# Patient Record
Sex: Male | Born: 1990 | Race: Black or African American | Hispanic: No | Marital: Single | Smoking: Current every day smoker
Health system: Southern US, Community
[De-identification: ages and names within clinical notes are randomized; demographics above are authoritative.]

---
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
## 2010-09-04 LAB — COMPREHENSIVE METABOLIC PANEL
ALT: 21 U/L (ref 0–53)
AST: 25 U/L (ref 0–37)
Albumin: 4.3 g/dL (ref 3.5–5.2)
Alkaline Phosphatase: 105 U/L (ref 52–171)
BUN: 10 mg/dL (ref 6–23)
CO2: 24 mEq/L (ref 19–32)
Calcium: 9.2 mg/dL (ref 8.4–10.5)
Chloride: 104 mEq/L (ref 96–112)
Creatinine, Ser: 1.09 mg/dL (ref 0.4–1.5)
Glucose, Bld: 84 mg/dL (ref 70–99)
Potassium: 4.1 mEq/L (ref 3.5–5.1)
Sodium: 142 mEq/L (ref 135–145)
Total Bilirubin: 1.4 mg/dL — ABNORMAL HIGH (ref 0.3–1.2)
Total Protein: 6.9 g/dL (ref 6.0–8.3)

## 2010-09-04 LAB — DIFFERENTIAL
Basophils Absolute: 0.1 10*3/uL (ref 0.0–0.1)
Basophils Relative: 1 % (ref 0–1)
Eosinophils Absolute: 0.1 10*3/uL (ref 0.0–1.2)
Eosinophils Relative: 1 % (ref 0–5)
Lymphocytes Relative: 14 % — ABNORMAL LOW (ref 24–48)
Lymphs Abs: 1.1 10*3/uL (ref 1.1–4.8)
Monocytes Absolute: 0.4 10*3/uL (ref 0.2–1.2)
Monocytes Relative: 5 % (ref 3–11)
Neutro Abs: 6.4 10*3/uL (ref 1.7–8.0)
Neutrophils Relative %: 80 % — ABNORMAL HIGH (ref 43–71)

## 2010-09-04 LAB — CBC
HCT: 44.4 % (ref 36.0–49.0)
Hemoglobin: 14.9 g/dL (ref 12.0–16.0)
MCHC: 33.5 g/dL (ref 31.0–37.0)
MCV: 95.7 fL (ref 78.0–98.0)
Platelets: 164 10*3/uL (ref 150–400)
RBC: 4.64 MIL/uL (ref 3.80–5.70)
RDW: 14.4 % (ref 11.4–15.5)
WBC: 8.1 10*3/uL (ref 4.5–13.5)

## 2010-09-04 LAB — ACETAMINOPHEN LEVEL: Acetaminophen (Tylenol), Serum: <10 ug/mL — ABNORMAL LOW (ref 10–30)

## 2010-09-04 LAB — SALICYLATE LEVEL: Salicylate Lvl: <4 mg/dL (ref 2.8–20.0)

## 2010-09-07 LAB — GC/CHLAMYDIA PROBE AMP, GENITAL
Chlamydia, DNA Probe: POSITIVE — AB
GC Probe Amp, Genital: NEGATIVE

## 2011-01-10 ENCOUNTER — Emergency Department (HOSPITAL_COMMUNITY)
Admission: EM | Admit: 2011-01-10 | Discharge: 2011-01-11 | Discharge status: Against Medical Advice | Payer: Self-pay | Attending: Emergency Medicine | Admitting: Emergency Medicine

## 2011-01-10 DIAGNOSIS — Z0389 Encounter for observation for other suspected diseases and conditions ruled out: Secondary | ICD-10-CM | POA: Insufficient documentation

## 2011-01-10 LAB — DIFFERENTIAL
Basophils Absolute: 0 10*3/uL (ref 0.0–0.1)
Basophils Relative: 1 % (ref 0–1)
Eosinophils Absolute: 0.2 10*3/uL (ref 0.0–0.7)
Eosinophils Relative: 4 % (ref 0–5)
Monocytes Absolute: 0.8 10*3/uL (ref 0.1–1.0)

## 2011-01-10 LAB — BASIC METABOLIC PANEL
BUN: 9 mg/dL (ref 6–23)
GFR calc Af Amer: >60 mL/min (ref >60)
GFR calc non Af Amer: >60 mL/min (ref >60)
Potassium: 4 mEq/L (ref 3.5–5.1)
Sodium: 139 mEq/L (ref 135–145)

## 2011-01-10 LAB — CBC
MCHC: 34.7 g/dL (ref 30.0–36.0)
Platelets: 173 10*3/uL (ref 150–400)
RDW: 13.9 % (ref 11.5–15.5)

## 2011-06-01 ENCOUNTER — Emergency Department (HOSPITAL_COMMUNITY): Payer: Self-pay

## 2011-06-01 ENCOUNTER — Emergency Department (HOSPITAL_COMMUNITY)
Admission: EM | Admit: 2011-06-01 | Discharge: 2011-06-01 | Disposition: A | Discharge status: Home / Self Care | Payer: Self-pay | Attending: Emergency Medicine | Admitting: Emergency Medicine

## 2011-06-01 ENCOUNTER — Encounter: Payer: Self-pay | Admitting: *Deleted

## 2011-06-01 DIAGNOSIS — R6884 Jaw pain: Secondary | ICD-10-CM | POA: Insufficient documentation

## 2011-06-01 DIAGNOSIS — R071 Chest pain on breathing: Secondary | ICD-10-CM | POA: Insufficient documentation

## 2011-06-01 DIAGNOSIS — F17200 Nicotine dependence, unspecified, uncomplicated: Secondary | ICD-10-CM | POA: Insufficient documentation

## 2011-06-01 DIAGNOSIS — S1190XA Unspecified open wound of unspecified part of neck, initial encounter: Secondary | ICD-10-CM | POA: Insufficient documentation

## 2011-06-01 DIAGNOSIS — R0789 Other chest pain: Secondary | ICD-10-CM

## 2011-06-01 MED ORDER — TRAMADOL HCL 50 MG PO TABS: 50.0000 mg | ORAL_TABLET | Freq: Four times a day (QID) | ORAL | Status: AC | PRN

## 2011-06-01 MED ORDER — IBUPROFEN 800 MG PO TABS: 800.0000 mg | ORAL_TABLET | Freq: Three times a day (TID) | ORAL | Status: AC
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
## 2011-06-04 NOTE — ED Provider Notes (Signed)
Medical screening examination/treatment/procedure(s) were performed by non-physician practitioner and as supervising physician I was immediately available for consultation/collaboration.   Sanaii Caporaso, MD 06/04/11 0759 

## 2011-11-30 ENCOUNTER — Emergency Department (HOSPITAL_COMMUNITY)
Admission: EM | Admit: 2011-11-30 | Discharge: 2011-11-30 | Disposition: A | Discharge status: Home / Self Care | Payer: Self-pay | Attending: Emergency Medicine | Admitting: Emergency Medicine

## 2011-11-30 ENCOUNTER — Encounter (HOSPITAL_COMMUNITY): Payer: Self-pay | Admitting: Emergency Medicine

## 2011-11-30 DIAGNOSIS — F17200 Nicotine dependence, unspecified, uncomplicated: Secondary | ICD-10-CM | POA: Insufficient documentation

## 2011-11-30 DIAGNOSIS — W268XXA Contact with other sharp object(s), not elsewhere classified, initial encounter: Secondary | ICD-10-CM | POA: Insufficient documentation

## 2011-11-30 DIAGNOSIS — S61409A Unspecified open wound of unspecified hand, initial encounter: Secondary | ICD-10-CM | POA: Insufficient documentation

## 2011-11-30 DIAGNOSIS — S61419A Laceration without foreign body of unspecified hand, initial encounter: Secondary | ICD-10-CM

## 2011-11-30 MED ORDER — LIDOCAINE-EPINEPHRINE-TETRACAINE (LET) SOLUTION
3.0000 mL | Freq: Once | NASAL | Status: AC
  Administered 2011-11-30: 3 mL via TOPICAL
  Filled 2011-11-30: qty 3
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
## 2011-11-30 NOTE — Discharge Instructions (Signed)

## 2012-01-08 ENCOUNTER — Emergency Department (INDEPENDENT_AMBULATORY_CARE_PROVIDER_SITE_OTHER)
Admission: EM | Admit: 2012-01-08 | Discharge: 2012-01-08 | Disposition: A | Discharge status: Home / Self Care | Payer: Self-pay | Source: Home / Self Care | Attending: Emergency Medicine | Admitting: Emergency Medicine

## 2012-01-08 ENCOUNTER — Encounter (HOSPITAL_COMMUNITY): Payer: Self-pay

## 2012-01-08 DIAGNOSIS — L089 Local infection of the skin and subcutaneous tissue, unspecified: Secondary | ICD-10-CM

## 2012-01-08 MED ORDER — AMOXICILLIN-POT CLAVULANATE 500-125 MG PO TABS: 1.0000 | ORAL_TABLET | Freq: Three times a day (TID) | ORAL | Status: AC

## 2012-01-08 NOTE — ED Provider Notes (Signed)
History     CSN: [id number]  Arrival date & time 01/08/12  1225   First MD Initiated Contact with Patient 01/08/12 1351      Chief Complaint  Patient presents with  . Rash    (Consider location/radiation/quality/duration/timing/severity/associated sxs/prior treatment) HPI Comments: Patient presents to urgent care complaining of an ongoing rash to the left forearm area (points to antecubital region), he started much smaller with a lot of itchiness. Then they turn into crusted-type rash as with purulent discharge. The last 2-3 days have noticed some of the similar lesions on his right forearm and elbow area. Patient had some residual permethrin from a previous diagnosis of scabies, but he decided to apply to this areas. Patient denies any improvement. Is somewhat tender and mildly itchy. Patient denies any constitutional symptoms such as fevers, malaise, arthralgias, myalgias or changes in appetite.  The history is provided by the patient.    History reviewed. No pertinent past medical history.  History reviewed. No pertinent past surgical history.  History reviewed. No pertinent family history.  History  Substance Use Topics  . Smoking status: Current Everyday Smoker -- 1.0 packs/day  . Smokeless tobacco: Not on file  . Alcohol Use: Yes     occasionally      Review of Systems  Constitutional: Negative for activity change and appetite change.  Skin: Positive for rash. Negative for color change and wound.    Allergies  Review of patient's allergies indicates no known allergies.  Home Medications   Current Outpatient Rx  Name Route Sig Dispense Refill  . AMOXICILLIN-POT CLAVULANATE 500-125 MG PO TABS Oral Take 1 tablet (500 mg total) by mouth every 8 (eight) hours. 30 tablet 0    BP 122/66  Pulse 62  Temp 98.9 F (37.2 C) (Oral)  Resp 16  SpO2 100%  Physical Exam  Nursing note and vitals reviewed. Constitutional: He appears well-developed and well-nourished.    HENT:  Head: Normocephalic.  Eyes: Conjunctivae are normal. No scleral icterus.  Neurological: He is alert.  Skin: Rash noted. Rash is papular and pustular. There is erythema.       ED Course  Procedures (including critical care time)  Labs Reviewed - No data to display No results found.   1. Superficial skin infection       Patient consented for pictures to be taking for medical records and documentation. MDM   Exam was consistent with a superficial skin infection most likely strep or staph, exam was suggestive for scaly crusted lesion resembling impetigo. Patient has been prescribed Augmentin for a course of 10 days. Instructed to return if no significant improvement is noted after 3-5 days.       Jimmie Molly, MD 01/08/12 [phone]

## 2012-01-08 NOTE — ED Notes (Signed)
C/o rash to lt antecubital for 10 days.  States in the last 4-5 days he has developed 2 other areas- rt forearm and rt elbow.  Used permethrin cream to lt antecubital without improvement.

## 2012-01-21 ENCOUNTER — Emergency Department (HOSPITAL_COMMUNITY): Admission: EM | Admit: 2012-01-21 | Discharge: 2012-01-21 | Payer: Self-pay | Source: Home / Self Care

## 2016-09-08 ENCOUNTER — Emergency Department (HOSPITAL_COMMUNITY)
Admission: EM | Admit: 2016-09-08 | Discharge: 2016-09-08 | Disposition: A | Discharge status: Home / Self Care | Payer: Self-pay | Attending: Emergency Medicine | Admitting: Emergency Medicine

## 2016-09-08 ENCOUNTER — Encounter (HOSPITAL_COMMUNITY): Payer: Self-pay | Admitting: Emergency Medicine

## 2016-09-08 DIAGNOSIS — F17200 Nicotine dependence, unspecified, uncomplicated: Secondary | ICD-10-CM | POA: Insufficient documentation

## 2016-09-08 DIAGNOSIS — R600 Localized edema: Secondary | ICD-10-CM

## 2016-09-08 LAB — CBC WITH DIFFERENTIAL/PLATELET
BASOS ABS: 0 10*3/uL (ref 0.0–0.1)
Basophils Relative: 0 %
EOS PCT: 3 %
Eosinophils Absolute: 0.2 10*3/uL (ref 0.0–0.7)
HCT: 40.4 % (ref 39.0–52.0)
HEMOGLOBIN: 13.3 g/dL (ref 13.0–17.0)
LYMPHS ABS: 1.8 10*3/uL (ref 0.7–4.0)
Lymphocytes Relative: 26 %
MCH: 31.4 pg (ref 26.0–34.0)
MCHC: 32.9 g/dL (ref 30.0–36.0)
MCV: 95.3 fL (ref 78.0–100.0)
Monocytes Absolute: 0.5 10*3/uL (ref 0.1–1.0)
Monocytes Relative: 8 %
NEUTROS PCT: 63 %
Neutro Abs: 4.2 10*3/uL (ref 1.7–7.7)
PLATELETS: 186 10*3/uL (ref 150–400)
RBC: 4.24 MIL/uL (ref 4.22–5.81)
RDW: 15.1 % (ref 11.5–15.5)
WBC: 6.7 10*3/uL (ref 4.0–10.5)

## 2016-09-08 LAB — COMPREHENSIVE METABOLIC PANEL
ALK PHOS: 61 U/L (ref 38–126)
ALT: 20 U/L (ref 17–63)
AST: 22 U/L (ref 15–41)
Albumin: 3.5 g/dL (ref 3.5–5.0)
Anion gap: 3 — ABNORMAL LOW (ref 5–15)
BUN: 9 mg/dL (ref 6–20)
CALCIUM: 9 mg/dL (ref 8.9–10.3)
CO2: 28 mmol/L (ref 22–32)
CREATININE: 1.08 mg/dL (ref 0.61–1.24)
Chloride: 108 mmol/L (ref 101–111)
GFR calc Af Amer: >60 mL/min (ref >60)
GFR calc non Af Amer: >60 mL/min (ref >60)
Glucose, Bld: 91 mg/dL (ref 65–99)
Potassium: 4.2 mmol/L (ref 3.5–5.1)
Sodium: 139 mmol/L (ref 135–145)
Total Bilirubin: 0.5 mg/dL (ref 0.3–1.2)
Total Protein: 5.7 g/dL — ABNORMAL LOW (ref 6.5–8.1)

## 2016-09-08 MED ORDER — FUROSEMIDE 10 MG/ML IJ SOLN
40.0000 mg | Freq: Once | INTRAMUSCULAR | Status: AC
  Administered 2016-09-08: 40 mg via INTRAVENOUS
  Filled 2016-09-08: qty 4

## 2016-09-08 MED ORDER — FUROSEMIDE 20 MG PO TABS: 20.0000 mg | ORAL_TABLET | Freq: Every day | ORAL | 0 refills | Status: DC

## 2016-09-08 NOTE — ED Notes (Signed)
Pt presents with swelling in ankles bilaterally he states this started about 2 days ago and has never experienced this before.

## 2016-09-08 NOTE — ED Provider Notes (Signed)
[facility]-EMERGENCY DEPT Provider Note   CSN: [id number] & time: 09/08/16  1022  By signing my name below, I, Marnette Burgess Long, attest that this documentation has been prepared under the direction and in the presence of Charlynne Pander, MD. Electronically Signed: Marnette Burgess Long, Scribe. 09/08/2016. 12:54 PM.   History   Chief Complaint Chief Complaint  Patient presents with  . Leg Swelling   The history is provided by the patient and medical records. No language interpreter was used.    HPI Comments:  Edward Petty is a 26 y.o. male with no pertinent PMHx, who presents to the Emergency Department complaining of constant, bilateral leg swelling onset two days ago. Pt reports this leg swelling happened two days ago and has been constant since onset affecting his bilateral lower legs and ankles. No acute injuries but states he works on his feet a lot for over 6 hours a day. He has never had this type of swelling before. He also notes a right ankle parole monitor that comes off later this month. No h/o DVT/PE. No recent travel or immobilizations. He did not try anything at home for relief of his symptoms. Pt denies abdominal pain, dysuria, hematuria, CP, SOB, and any other complaints at this time.    History reviewed. No pertinent past medical history.  There are no active problems to display for this patient.  History reviewed. No pertinent surgical history.   Home Medications    Prior to Admission medications   Not on File    Family History History reviewed. No pertinent family history.  Social History Social History  Substance Use Topics  . Smoking status: Current Every Day Smoker    Packs/day: 1.00  . Smokeless tobacco: Never Used  . Alcohol use Yes     Comment: occasionally     Allergies   Patient has no known allergies.   Review of Systems Review of Systems  Respiratory: Negative for shortness of breath.   Cardiovascular: Positive for leg swelling.  Negative for chest pain.  Gastrointestinal: Negative for abdominal pain.  Genitourinary: Negative for dysuria and hematuria.  All other systems reviewed and are negative.    Physical Exam Updated Vital Signs BP 113/67 (BP Location: Left Arm)   Pulse (!) 55   Temp 98.6 F (37 C) (Oral)   Resp 18   SpO2 100%   Physical Exam  Constitutional: He is oriented to person, place, and time. He appears well-developed and well-nourished.  HENT:  Head: Normocephalic and atraumatic.  Right Ear: External ear normal.  Left Ear: External ear normal.  Nose: Nose normal.  Eyes: Right eye exhibits no discharge. Left eye exhibits no discharge.  Neck: Neck supple.  Cardiovascular: Normal rate, regular rhythm and normal heart sounds.   1+ edema bilateral legs. No calf tenderness. Dorsalis pedis pulses 2+.  Pulmonary/Chest: Effort normal and breath sounds normal.  Abdominal: Soft. There is no tenderness.  Musculoskeletal: He exhibits no edema.   Right leg electronic monitoring in place, no skin breakdown.   Neurological: He is alert and oriented to person, place, and time.  Skin: Skin is warm and dry.  Nursing note and vitals reviewed.    ED Treatments / Results  DIAGNOSTIC STUDIES:  Oxygen Saturation is 100% on RA, normal by my interpretation.    COORDINATION OF CARE:  12:30 PM Discussed treatment plan with pt at bedside including Lasix and blood work and pt agreed to plan.  2:15 PM Went over lab results  with patient and re-evaluated.   Labs (all labs ordered are listed, but only abnormal results are displayed) Labs Reviewed  COMPREHENSIVE METABOLIC PANEL - Abnormal; Notable for the following:       Result Value   Total Protein 5.7 (*)    Anion gap 3 (*)    All other components within normal limits  CBC WITH DIFFERENTIAL/PLATELET    EKG  EKG Interpretation None       Radiology No results found.  Procedures Procedures (including critical care time)  Medications Ordered  in ED Medications - No data to display   Initial Impression / Assessment and Plan / ED Course  I have reviewed the triage vital signs and the nursing notes.  Pertinent labs & imaging results that were available during my care of the patient were reviewed by me and considered in my medical decision making (see chart for details).    Edward Petty is a 26 y.o. male here with bilateral leg swelling. No chest pain or SOB. No hx of CHF. Chemistry unremarkable. I doubt DVT, he has been on his feet a lot. Given lasix 40 mg IV here, will dc home with several days of lasix.    Final Clinical Impressions(s) / ED Diagnoses   Final diagnoses:  None    New Prescriptions New Prescriptions   No medications on file   I personally performed the services described in this documentation, which was scribed in my presence. The recorded information has been reviewed and is accurate.     Charlynne Pander, MD 09/08/16 [phone]

## 2016-09-08 NOTE — ED Triage Notes (Signed)
Pt states he has been having leg swelling for two days and bilateral leg pain. Denies other symptoms. No CP/SOB

## 2016-09-08 NOTE — Discharge Instructions (Signed)
Take lasix 20 mg daily x 4 days.   Elevate your legs, try and stay off your feet.   See your doctor  Return to ER if you have more leg swelling, chest pain, trouble breathing.

## 2017-02-18 ENCOUNTER — Emergency Department (HOSPITAL_COMMUNITY)
Admission: EM | Admit: 2017-02-18 | Discharge: 2017-02-18 | Discharge status: Against Medical Advice | Payer: Self-pay | Attending: Emergency Medicine | Admitting: Emergency Medicine

## 2017-02-18 ENCOUNTER — Encounter (HOSPITAL_COMMUNITY): Payer: Self-pay

## 2017-02-18 DIAGNOSIS — Z5321 Procedure and treatment not carried out due to patient leaving prior to being seen by health care provider: Secondary | ICD-10-CM | POA: Insufficient documentation

## 2017-02-18 DIAGNOSIS — R079 Chest pain, unspecified: Secondary | ICD-10-CM | POA: Insufficient documentation
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
## 2017-04-22 ENCOUNTER — Emergency Department (HOSPITAL_COMMUNITY)
Admission: EM | Admit: 2017-04-22 | Discharge: 2017-04-22 | Disposition: A | Discharge status: Home / Self Care | Payer: Self-pay | Attending: Emergency Medicine | Admitting: Emergency Medicine

## 2017-04-22 ENCOUNTER — Other Ambulatory Visit: Payer: Self-pay

## 2017-04-22 ENCOUNTER — Encounter (HOSPITAL_COMMUNITY): Payer: Self-pay | Admitting: Emergency Medicine

## 2017-04-22 DIAGNOSIS — M436 Torticollis: Secondary | ICD-10-CM | POA: Insufficient documentation

## 2017-04-22 DIAGNOSIS — F17210 Nicotine dependence, cigarettes, uncomplicated: Secondary | ICD-10-CM | POA: Insufficient documentation

## 2017-04-22 MED ORDER — NAPROXEN 250 MG PO TABS
500.0000 mg | ORAL_TABLET | Freq: Once | ORAL | Status: AC
  Administered 2017-04-22: 500 mg via ORAL
  Filled 2017-04-22: qty 2

## 2017-04-22 MED ORDER — LIDOCAINE 5 % EX PTCH: 1.0000 | MEDICATED_PATCH | CUTANEOUS | 0 refills | Status: DC

## 2017-04-22 MED ORDER — CYCLOBENZAPRINE HCL 10 MG PO TABS: 10.0000 mg | ORAL_TABLET | Freq: Two times a day (BID) | ORAL | 0 refills | Status: DC | PRN

## 2017-04-22 MED ORDER — NAPROXEN 500 MG PO TABS: 500.0000 mg | ORAL_TABLET | Freq: Two times a day (BID) | ORAL | 0 refills | Status: DC

## 2017-04-22 MED ORDER — HYDROCODONE-ACETAMINOPHEN 5-325 MG PO TABS
1.0000 | ORAL_TABLET | Freq: Once | ORAL | Status: AC
  Administered 2017-04-22: 1 via ORAL
  Filled 2017-04-22: qty 1

## 2017-04-22 MED ORDER — LIDOCAINE 5 % EX PTCH
1.0000 | MEDICATED_PATCH | CUTANEOUS | Status: DC
  Administered 2017-04-22: 1 via TRANSDERMAL
  Filled 2017-04-22: qty 1

## 2017-04-22 NOTE — Discharge Instructions (Addendum)
Use Naproxen as prescribed and apply a Lidoderm patch for pain. You may try Flexeril for persistent pain, if needed. Follow up with a primary care doctor to ensure resolution of symptoms.

## 2017-04-22 NOTE — ED Triage Notes (Signed)
Pt presents to ED for assessment of 2 weeks of left sided neck pain when he wakes from sleeping, and soreness when trying to look right or look down.  Pt denies any injury.

## 2017-04-22 NOTE — ED Provider Notes (Signed)
[facility]   CSN: [id number] Arrival date & time: 04/22/17  0003     History   Chief Complaint Chief Complaint  Patient presents with  . Neck Pain    HPI Edward Petty is a 26 y.o. male.  The history is provided by the patient. No language interpreter was used.  Neck Pain   This is a new problem. Episode onset: 2.5 weeks ago. The problem occurs constantly. The problem has not changed since onset.The pain is associated with an unknown factor. There has been no fever. Pain location: L sided neck. The quality of the pain is described as aching. The pain radiates to the left shoulder. Exacerbated by: neck movement. Pertinent negatives include no numbness, no tingling and no weakness. He has tried heat for the symptoms. The treatment provided no relief.    History reviewed. No pertinent past medical history.  There are no active problems to display for this patient.   History reviewed. No pertinent surgical history.     Home Medications    Prior to Admission medications   Medication Sig Start Date End Date Taking? Authorizing Provider  cyclobenzaprine (FLEXERIL) 10 MG tablet Take 1 tablet (10 mg total) by mouth 2 (two) times daily as needed for muscle spasms. 04/22/17   Antony MaduraHumes, Alyssah Algeo, PA-C  furosemide (LASIX) 20 MG tablet Take 1 tablet (20 mg total) by mouth daily. 09/08/16   Charlynne PanderYao, David Hsienta, MD  lidocaine (LIDODERM) 5 % Place 1 patch onto the skin daily. Remove & Discard patch within 12 hours or as directed by MD 04/22/17   Antony MaduraHumes, Ninetta Adelstein, PA-C  naproxen (NAPROSYN) 500 MG tablet Take 1 tablet (500 mg total) by mouth 2 (two) times daily. 04/22/17   Antony MaduraHumes, Kaylan Yates, PA-C    Family History History reviewed. No pertinent family history.  Social History Social History   Tobacco Use  . Smoking status: Current Every Day Smoker    Packs/day: 1.00  . Smokeless tobacco: Never Used  Substance Use Topics  . Alcohol use: Yes   Comment: occasionally  . Drug use: No    Comment: molly      Allergies   Patient has no known allergies.   Review of Systems Review of Systems  Musculoskeletal: Positive for neck pain.  Neurological: Negative for tingling, weakness and numbness.   Ten systems reviewed and are negative for acute change, except as noted in the HPI.    Physical Exam Updated Vital Signs BP 117/64 (BP Location: Right Arm)   Pulse 69   Temp 98 F (36.7 C) (Oral)   Resp 16   Ht 5\' 10"  (1.778 m)   Wt 81.6 kg (180 lb)   SpO2 99%   BMI 25.83 kg/m   Physical Exam  Constitutional: He is oriented to person, place, and time. He appears well-developed and well-nourished. No distress.  Nontoxic appearing and in NAD  HENT:  Head: Normocephalic and atraumatic.  Eyes: Conjunctivae and EOM are normal. No scleral icterus.  Neck: Normal range of motion.  No nuchal rigidity or meningismus.  Normal range of motion.  No tenderness to palpation to the cervical midline.  No bony deformities, step-offs, or crepitus.  Minimal reproducible tenderness along the left cervical paraspinal muscles.  Cardiovascular: Normal rate, regular rhythm and intact distal pulses.  Pulmonary/Chest: Effort normal. No respiratory distress.  Respirations even and unlabored  Musculoskeletal: Normal range of motion.  Neurological: He is alert and oriented to person, place, and time. He  exhibits normal muscle tone. Coordination normal.  Sensation to light touch intact.  Grip strength 5/5 bilaterally.  There is normal strength against resistance in all major muscle groups of the bilateral upper extremities.  Patient ambulatory with steady gait.  Skin: Skin is warm and dry. No rash noted. He is not diaphoretic. No erythema. No pallor.  Psychiatric: He has a normal mood and affect. His behavior is normal.  Nursing note and vitals reviewed.    ED Treatments / Results  Labs (all labs ordered are listed, but only abnormal results are  displayed) Labs Reviewed - No data to display  EKG  EKG Interpretation None       Radiology No results found.  Procedures Procedures (including critical care time)  Medications Ordered in ED Medications  lidocaine (LIDODERM) 5 % 1 patch (1 patch Transdermal Patch Applied 04/22/17 0309)  naproxen (NAPROSYN) tablet 500 mg (500 mg Oral Given 04/22/17 0308)  HYDROcodone-acetaminophen (NORCO/VICODIN) 5-325 MG per tablet 1 tablet (1 tablet Oral Given 04/22/17 0308)     Initial Impression / Assessment and Plan / ED Course  I have reviewed the triage vital signs and the nursing notes.  Pertinent labs & imaging results that were available during my care of the patient were reviewed by me and considered in my medical decision making (see chart for details).     26 year old male presents to the emergency department for neck pain.  Pain is aggravated with movement.  Patient is neurovascularly intact.  He denies a history of trauma or injury.  No recent fevers.  Suspect muscle spasm which will be managed supportively on an outpatient basis.  Return precautions discussed and provided. Patient discharged in stable condition with no unaddressed concerns.   Final Clinical Impressions(s) / ED Diagnoses   Final diagnoses:  Torticollis, acute    ED Discharge Orders        Ordered    naproxen (NAPROSYN) 500 MG tablet  2 times daily     04/22/17 0249    lidocaine (LIDODERM) 5 %  Every 24 hours     04/22/17 0249    cyclobenzaprine (FLEXERIL) 10 MG tablet  2 times daily PRN     04/22/17 0249       Antony MaduraHumes, Tyanne Derocher, PA-C 04/22/17 0353    Palumbo, April, MD 04/22/17 [id number]

## 2017-04-22 NOTE — ED Notes (Signed)
Patient able to ambulate independently  

## 2017-07-09 ENCOUNTER — Emergency Department (HOSPITAL_COMMUNITY)
Admission: EM | Admit: 2017-07-09 | Discharge: 2017-07-09 | Disposition: A | Discharge status: Home / Self Care | Payer: Self-pay

## 2017-07-09 ENCOUNTER — Other Ambulatory Visit: Payer: Self-pay

## 2017-07-09 NOTE — ED Notes (Signed)
Called for triage, no answer

## 2017-07-09 NOTE — ED Notes (Signed)
Called 3rd time for triage, no response

## 2017-07-09 NOTE — ED Notes (Signed)
Called for triage. No response. 

## 2017-08-17 ENCOUNTER — Encounter (HOSPITAL_COMMUNITY): Payer: Self-pay | Admitting: Emergency Medicine

## 2017-08-17 ENCOUNTER — Emergency Department (HOSPITAL_COMMUNITY)
Admission: EM | Admit: 2017-08-17 | Discharge: 2017-08-17 | Disposition: A | Discharge status: Home / Self Care | Payer: Self-pay | Attending: Emergency Medicine | Admitting: Emergency Medicine

## 2017-08-17 DIAGNOSIS — R1084 Generalized abdominal pain: Secondary | ICD-10-CM | POA: Insufficient documentation

## 2017-08-17 DIAGNOSIS — R6883 Chills (without fever): Secondary | ICD-10-CM | POA: Insufficient documentation

## 2017-08-17 DIAGNOSIS — R1110 Vomiting, unspecified: Secondary | ICD-10-CM | POA: Insufficient documentation

## 2017-08-17 DIAGNOSIS — Z5321 Procedure and treatment not carried out due to patient leaving prior to being seen by health care provider: Secondary | ICD-10-CM | POA: Insufficient documentation

## 2017-08-17 LAB — COMPREHENSIVE METABOLIC PANEL
ALK PHOS: 60 U/L (ref 38–126)
ALT: 13 U/L — ABNORMAL LOW (ref 17–63)
AST: 18 U/L (ref 15–41)
Albumin: 4.2 g/dL (ref 3.5–5.0)
Anion gap: 10 (ref 5–15)
BILIRUBIN TOTAL: 1.6 mg/dL — AB (ref 0.3–1.2)
BUN: 8 mg/dL (ref 6–20)
CALCIUM: 9.3 mg/dL (ref 8.9–10.3)
CO2: 23 mmol/L (ref 22–32)
Chloride: 106 mmol/L (ref 101–111)
Creatinine, Ser: 1.06 mg/dL (ref 0.61–1.24)
GFR calc Af Amer: >60 mL/min (ref >60)
Glucose, Bld: 96 mg/dL (ref 65–99)
POTASSIUM: 3.7 mmol/L (ref 3.5–5.1)
Sodium: 139 mmol/L (ref 135–145)
TOTAL PROTEIN: 7 g/dL (ref 6.5–8.1)

## 2017-08-17 LAB — LIPASE, BLOOD: Lipase: 21 U/L (ref 11–51)

## 2017-08-17 LAB — CBC
HEMATOCRIT: 45.2 % (ref 39.0–52.0)
Hemoglobin: 15.5 g/dL (ref 13.0–17.0)
MCH: 32.6 pg (ref 26.0–34.0)
MCHC: 34.3 g/dL (ref 30.0–36.0)
MCV: 95.2 fL (ref 78.0–100.0)
PLATELETS: 165 10*3/uL (ref 150–400)
RBC: 4.75 MIL/uL (ref 4.22–5.81)
RDW: 13.6 % (ref 11.5–15.5)
WBC: 6.5 10*3/uL (ref 4.0–10.5)

## 2017-08-17 MED ORDER — ONDANSETRON 4 MG PO TBDP
4.0000 mg | ORAL_TABLET | Freq: Once | ORAL | Status: AC | PRN
  Administered 2017-08-17: 4 mg via ORAL
  Filled 2017-08-17: qty 1

## 2017-08-17 NOTE — ED Notes (Signed)
Patient called for room x 2 with no response from lobby.

## 2017-08-17 NOTE — ED Triage Notes (Signed)
Patient presents ambulatory c/o emesis and hot flashes/chills onset of this am. Reports about 3 episodes of emesis today. C/o generalized abdominal pain.

## 2017-08-17 NOTE — ED Notes (Signed)
Patient called with no response.

## 2017-08-17 NOTE — ED Notes (Signed)
Called for room no response 

## 2018-01-15 ENCOUNTER — Encounter (HOSPITAL_COMMUNITY): Payer: Self-pay | Admitting: Emergency Medicine

## 2018-01-15 ENCOUNTER — Ambulatory Visit (HOSPITAL_COMMUNITY)
Admission: EM | Admit: 2018-01-15 | Discharge: 2018-01-15 | Disposition: A | Discharge status: Home / Self Care | Payer: Self-pay | Attending: Physician Assistant | Admitting: Physician Assistant

## 2018-01-15 DIAGNOSIS — R0981 Nasal congestion: Secondary | ICD-10-CM

## 2018-01-15 DIAGNOSIS — R05 Cough: Secondary | ICD-10-CM

## 2018-01-15 DIAGNOSIS — B9789 Other viral agents as the cause of diseases classified elsewhere: Secondary | ICD-10-CM

## 2018-01-15 DIAGNOSIS — F17210 Nicotine dependence, cigarettes, uncomplicated: Secondary | ICD-10-CM | POA: Insufficient documentation

## 2018-01-15 DIAGNOSIS — Z79899 Other long term (current) drug therapy: Secondary | ICD-10-CM | POA: Insufficient documentation

## 2018-01-15 DIAGNOSIS — Z711 Person with feared health complaint in whom no diagnosis is made: Secondary | ICD-10-CM

## 2018-01-15 DIAGNOSIS — Z113 Encounter for screening for infections with a predominantly sexual mode of transmission: Secondary | ICD-10-CM

## 2018-01-15 DIAGNOSIS — Z114 Encounter for screening for human immunodeficiency virus [HIV]: Secondary | ICD-10-CM | POA: Insufficient documentation

## 2018-01-15 DIAGNOSIS — B349 Viral infection, unspecified: Secondary | ICD-10-CM | POA: Insufficient documentation

## 2018-01-15 MED ORDER — BENZONATATE 100 MG PO CAPS: 100.0000 mg | ORAL_CAPSULE | Freq: Three times a day (TID) | ORAL | 0 refills | Status: DC

## 2018-01-15 NOTE — ED Provider Notes (Signed)
[facility]    CSN: [id number] Arrival date & time: 01/15/18  1150   History   Chief Complaint Chief Complaint  Patient presents with  . SEXUALLY TRANSMITTED DISEASE    HPI Edward Petty is a 27 y.o. male.   27 year old male comes in for multiple complaints.  STD testing. Asymptomatic, no obvious exposure. Denies fever, chills, night sweats. Denies abdominal pain, nausea, vomiting. Denies urinary symptoms such as frequency, dysuria, hematuria. Denies penile discharge, penile lesion, testicular swelling/pain. Sexually active with 1 male partner, occasional condom use. Requesting HIV, syphilis, HSV testing.   Patient has also had 1.5-week history of URI symptoms.  Has had cough, rhinorrhea, nasal congestion.  Cough is nonproductive.  Denies fever, chills, night sweats.  Denies chest pain, shortness of breath, wheezing.  OTC TheraFlu with some relief.  States symptoms has been improving, but mention it as he is already here for STD testing.  He has no sick contact.  Current everyday smoker, 10-11-pack-year history.     History reviewed. No pertinent past medical history.  There are no active problems to display for this patient.   History reviewed. No pertinent surgical history.     Home Medications    Prior to Admission medications   Medication Sig Start Date End Date Taking? Authorizing Provider  benzonatate (TESSALON) 100 MG capsule Take 1 capsule (100 mg total) by mouth every 8 (eight) hours. 01/15/18   Belinda FisherYu, Mara Favero V, PA-C    Family History No family history on file.  Social History Social History   Tobacco Use  . Smoking status: Current Every Day Smoker    Packs/day: 1.00  . Smokeless tobacco: Never Used  Substance Use Topics  . Alcohol use: Yes    Comment: occasionally  . Drug use: No    Comment: molly      Allergies   Patient has no known allergies.   Review of Systems Review of Systems  Reason unable to perform ROS: See HPI as above.      Physical Exam Triage Vital Signs ED Triage Vitals  Enc Vitals Group     BP 01/15/18 1212 127/80     Pulse Rate 01/15/18 1212 60     Resp 01/15/18 1212 18     Temp 01/15/18 1212 98.6 F (37 C)     Temp src --      SpO2 01/15/18 1212 98 %     Weight --      Height --      Head Circumference --      Peak Flow --      Pain Score 01/15/18 1213 0     Pain Loc --      Pain Edu? --      Excl. in GC? --    No data found.  Updated Vital Signs BP 127/80   Pulse 60   Temp 98.6 F (37 C)   Resp 18   SpO2 98%   Physical Exam  Constitutional: He is oriented to person, place, and time. He appears well-developed and well-nourished. No distress.  HENT:  Head: Normocephalic and atraumatic.  Right Ear: Tympanic membrane, external ear and ear canal normal. Tympanic membrane is not erythematous and not bulging.  Left Ear: Tympanic membrane, external ear and ear canal normal. Tympanic membrane is not erythematous and not bulging.  Nose: Nose normal. Right sinus exhibits no maxillary sinus tenderness and no frontal sinus tenderness. Left sinus exhibits no maxillary sinus tenderness and no frontal  sinus tenderness.  Mouth/Throat: Uvula is midline, oropharynx is clear and moist and mucous membranes are normal.  Eyes: Pupils are equal, round, and reactive to light. Conjunctivae are normal.  Neck: Normal range of motion. Neck supple.  Cardiovascular: Normal rate, regular rhythm and normal heart sounds. Exam reveals no gallop and no friction rub.  No murmur heard. Pulmonary/Chest: Effort normal and breath sounds normal. No accessory muscle usage or stridor. No respiratory distress. He has no decreased breath sounds. He has no wheezes. He has no rhonchi. He has no rales.  Lymphadenopathy:    He has no cervical adenopathy.  Neurological: He is alert and oriented to person, place, and time.  Skin: Skin is warm and dry.  Psychiatric: He has a normal mood and affect. His behavior is normal.  Judgment normal.   UC Treatments / Results  Labs (all labs ordered are listed, but only abnormal results are displayed) Labs Reviewed  HIV ANTIBODY (ROUTINE TESTING)  RPR  HSV 1 ANTIBODY, IGG  HSV 2 ANTIBODY, IGG  URINE CYTOLOGY ANCILLARY ONLY   EKG None  Radiology No results found.  Procedures Procedures (including critical care time)  Medications Ordered in UC Medications - No data to display  Initial Impression / Assessment and Plan / UC Course  I have reviewed the triage vital signs and the nursing notes.  Pertinent labs & imaging results that were available during my care of the patient were reviewed by me and considered in my medical decision making (see chart for details).    Blood work and cytology sent, patient will be contacted with any positive results that require additional treatment. Patient to refrain from sexual activity for the next 7 days. Return precautions given.   Discussed with patient history and exam most consistent with viral URI. Exam unremarkable. Patient with improving symptoms. Symptomatic treatment as needed. Push fluids. Return precautions given.   Final Clinical Impressions(s) / UC Diagnoses   Final diagnoses:  Concern about STD in male without diagnosis  Viral illness    ED Prescriptions    Medication Sig Dispense Auth. Provider   benzonatate (TESSALON) 100 MG capsule Take 1 capsule (100 mg total) by mouth every 8 (eight) hours. 21 capsule Threasa AlphaYu, Wendle Kina V, PA-C       Adilynne Fitzwater V, [place] JerseyPA-C 01/15/18 1236

## 2018-01-15 NOTE — Discharge Instructions (Signed)
As discussed, HSV testing does not indicate when you were infected, whether you are having an outbreak. Testing sent, you will be contacted with any positive results that requires further treatment. Refrain from sexual activity and alcohol use for the next 7 days. Monitor for any worsening of symptoms, fever, abdominal pain, nausea, vomiting, penile lesion/sore, testicular swelling/pain, to follow up for reevaluation.  As discussed, given your smoking history, coughing may last for longer than 2 weeks. Tessalon for cough. You can use over the counter flonase, zyrtec-D for nasal congestion/drainage. You can use over the counter nasal saline rinse such as neti pot for nasal congestion. Keep hydrated, your urine should be clear to pale yellow in color. Tylenol/motrin for fever and pain. Monitor for any worsening of symptoms, chest pain, shortness of breath, wheezing, swelling of the throat, follow up for reevaluation.   For sore throat/cough try using a honey-based tea. Use 3 teaspoons of honey with juice squeezed from half lemon. Place shaved pieces of ginger into 1/2-1 cup of water and warm over stove top. Then mix the ingredients and repeat every 4 hours as needed.

## 2018-01-15 NOTE — ED Triage Notes (Signed)
Pt wants to be tested for STds. Also c/o cold symptoms. Denies exposure.

## 2018-01-16 LAB — HSV 2 ANTIBODY, IGG: HSV 2 GLYCOPROTEIN G AB, IGG: 8.24 {index} — AB (ref 0.00–0.90)

## 2018-01-16 LAB — RPR: RPR Ser Ql: NONREACTIVE

## 2018-01-16 LAB — HIV ANTIBODY (ROUTINE TESTING W REFLEX): HIV Screen 4th Generation wRfx: NONREACTIVE

## 2018-01-16 LAB — HSV 1 ANTIBODY, IGG: HSV 1 GLYCOPROTEIN G AB, IGG: 1.37 {index} — AB (ref 0.00–0.90)

## 2018-01-18 ENCOUNTER — Telehealth (HOSPITAL_COMMUNITY): Payer: Self-pay

## 2018-01-18 LAB — URINE CYTOLOGY ANCILLARY ONLY
Chlamydia: POSITIVE — AB
NEISSERIA GONORRHEA: NEGATIVE
TRICH (WINDOWPATH): NEGATIVE

## 2018-01-18 NOTE — Telephone Encounter (Signed)
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 Pt denies any rash's or lesions at this time. Encouraged patient to establish care with PCP. Educated on safe sex practices.

## 2018-01-21 ENCOUNTER — Telehealth (HOSPITAL_COMMUNITY): Payer: Self-pay

## 2018-01-21 MED ORDER — AZITHROMYCIN 250 MG PO TABS: 1000.0000 mg | ORAL_TABLET | Freq: Once | ORAL | 0 refills | Status: AC

## 2018-01-21 NOTE — Telephone Encounter (Signed)
Pt aware of results and rx.  Complains of rash on his penis. Per Dr. Tracie HarrierHagler since the patient was not swabbed for Herpes the patient needs to be re-evaluated before treatment.

## 2018-01-21 NOTE — Telephone Encounter (Signed)
Chlamydia is positive.  Rx po zithromax 1g #1 dose no refills was sent to the pharmacy of record.  Need to educate to please refrain from sexual intercourse for 7 days to give the medicine time to work, sexual partners need to be notified and tested/treated.  Condoms may reduce risk of reinfection.  Recheck or followup with PCP for further evaluation if symptoms are not improving.   [facility] notified.  Attempted to reach patient. No answer at this time. Voicemail left.  

## 2018-02-10 ENCOUNTER — Emergency Department (HOSPITAL_COMMUNITY): Payer: Self-pay

## 2018-02-10 ENCOUNTER — Other Ambulatory Visit: Payer: Self-pay

## 2018-02-10 ENCOUNTER — Emergency Department (HOSPITAL_COMMUNITY)
Admission: EM | Admit: 2018-02-10 | Discharge: 2018-02-10 | Disposition: A | Discharge status: Home / Self Care | Payer: Self-pay | Attending: Emergency Medicine | Admitting: Emergency Medicine

## 2018-02-10 ENCOUNTER — Encounter (HOSPITAL_COMMUNITY): Payer: Self-pay | Admitting: *Deleted

## 2018-02-10 DIAGNOSIS — W3400XA Accidental discharge from unspecified firearms or gun, initial encounter: Secondary | ICD-10-CM | POA: Insufficient documentation

## 2018-02-10 DIAGNOSIS — F17210 Nicotine dependence, cigarettes, uncomplicated: Secondary | ICD-10-CM | POA: Insufficient documentation

## 2018-02-10 DIAGNOSIS — Y998 Other external cause status: Secondary | ICD-10-CM | POA: Insufficient documentation

## 2018-02-10 DIAGNOSIS — S71002A Unspecified open wound, left hip, initial encounter: Secondary | ICD-10-CM | POA: Insufficient documentation

## 2018-02-10 DIAGNOSIS — Y929 Unspecified place or not applicable: Secondary | ICD-10-CM | POA: Insufficient documentation

## 2018-02-10 DIAGNOSIS — Y9389 Activity, other specified: Secondary | ICD-10-CM | POA: Insufficient documentation

## 2018-02-10 DIAGNOSIS — Z23 Encounter for immunization: Secondary | ICD-10-CM | POA: Insufficient documentation

## 2018-02-10 LAB — CBC
HCT: 45.4 % (ref 39.0–52.0)
Hemoglobin: 15.5 g/dL (ref 13.0–17.0)
MCH: 32.4 pg (ref 26.0–34.0)
MCHC: 34.1 g/dL (ref 30.0–36.0)
MCV: 94.8 fL (ref 78.0–100.0)
PLATELETS: 196 10*3/uL (ref 150–400)
RBC: 4.79 MIL/uL (ref 4.22–5.81)
RDW: 13.7 % (ref 11.5–15.5)
WBC: 5.1 10*3/uL (ref 4.0–10.5)

## 2018-02-10 LAB — COMPREHENSIVE METABOLIC PANEL
ALT: 22 U/L (ref 0–44)
ANION GAP: 13 (ref 5–15)
AST: 32 U/L (ref 15–41)
Albumin: 4.8 g/dL (ref 3.5–5.0)
Alkaline Phosphatase: 76 U/L (ref 38–126)
BILIRUBIN TOTAL: 1.1 mg/dL (ref 0.3–1.2)
BUN: 11 mg/dL (ref 6–20)
CALCIUM: 9.8 mg/dL (ref 8.9–10.3)
CO2: 22 mmol/L (ref 22–32)
Chloride: 107 mmol/L (ref 98–111)
Creatinine, Ser: 1.23 mg/dL (ref 0.61–1.24)
GFR calc Af Amer: >60 mL/min (ref >60)
Glucose, Bld: 96 mg/dL (ref 70–99)
POTASSIUM: 4.2 mmol/L (ref 3.5–5.1)
Sodium: 142 mmol/L (ref 135–145)
TOTAL PROTEIN: 7.8 g/dL (ref 6.5–8.1)

## 2018-02-10 LAB — SAMPLE TO BLOOD BANK

## 2018-02-10 LAB — I-STAT CHEM 8, ED
BUN: 10 mg/dL (ref 6–20)
CALCIUM ION: 1.19 mmol/L (ref 1.15–1.40)
CREATININE: 1.4 mg/dL — AB (ref 0.61–1.24)
Chloride: 106 mmol/L (ref 98–111)
Glucose, Bld: 96 mg/dL (ref 70–99)
HCT: 47 % (ref 39.0–52.0)
HEMOGLOBIN: 16 g/dL (ref 13.0–17.0)
Potassium: 3.8 mmol/L (ref 3.5–5.1)
SODIUM: 139 mmol/L (ref 135–145)
TCO2: 23 mmol/L (ref 22–32)

## 2018-02-10 MED ORDER — TETANUS-DIPHTH-ACELL PERTUSSIS 5-2.5-18.5 LF-MCG/0.5 IM SUSP
0.5000 mL | Freq: Once | INTRAMUSCULAR | Status: AC
  Administered 2018-02-10: 0.5 mL via INTRAMUSCULAR
  Filled 2018-02-10: qty 0.5

## 2018-02-10 MED ORDER — CEPHALEXIN 500 MG PO CAPS: 1000.0000 mg | ORAL_CAPSULE | Freq: Four times a day (QID) | ORAL | 0 refills | Status: DC

## 2018-02-10 MED ORDER — IOPAMIDOL (ISOVUE-300) INJECTION 61%
100.0000 mL | Freq: Once | INTRAVENOUS | Status: AC | PRN
  Administered 2018-02-10: 100 mL via INTRAVENOUS

## 2018-02-10 MED ORDER — ACETAMINOPHEN 500 MG PO TABS
1000.0000 mg | ORAL_TABLET | Freq: Once | ORAL | Status: AC
  Administered 2018-02-10: 1000 mg via ORAL
  Filled 2018-02-10: qty 2

## 2018-02-10 MED ORDER — IOPAMIDOL (ISOVUE-300) INJECTION 61%
INTRAVENOUS | Status: AC
  Filled 2018-02-10: qty 100
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
## 2018-02-10 NOTE — Discharge Instructions (Addendum)
Take Keflex as prescribed to avoid infection. Keep wound clean and covered until healed. Follow up with the Trauma Clinic if you have any worsening pain, fever, drainage from the wound or for new concern. Return to the ED as needed.

## 2018-02-10 NOTE — ED Notes (Signed)
X-ray at bedside
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
## 2018-06-06 ENCOUNTER — Other Ambulatory Visit: Payer: Self-pay

## 2018-06-06 ENCOUNTER — Encounter (HOSPITAL_COMMUNITY): Payer: Self-pay | Admitting: Emergency Medicine

## 2018-06-06 ENCOUNTER — Ambulatory Visit (HOSPITAL_COMMUNITY)
Admission: EM | Admit: 2018-06-06 | Discharge: 2018-06-06 | Disposition: A | Discharge status: Home / Self Care | Payer: Medicaid Other | Attending: Emergency Medicine | Admitting: Emergency Medicine

## 2018-06-06 DIAGNOSIS — R369 Urethral discharge, unspecified: Secondary | ICD-10-CM

## 2018-06-06 DIAGNOSIS — Z711 Person with feared health complaint in whom no diagnosis is made: Secondary | ICD-10-CM

## 2018-06-06 DIAGNOSIS — Z202 Contact with and (suspected) exposure to infections with a predominantly sexual mode of transmission: Secondary | ICD-10-CM

## 2018-06-06 DIAGNOSIS — R300 Dysuria: Secondary | ICD-10-CM

## 2018-06-06 DIAGNOSIS — Z113 Encounter for screening for infections with a predominantly sexual mode of transmission: Secondary | ICD-10-CM

## 2018-06-06 MED ORDER — AZITHROMYCIN 250 MG PO TABS
1000.0000 mg | ORAL_TABLET | Freq: Once | ORAL | Status: AC
  Administered 2018-06-06: 1000 mg via ORAL

## 2018-06-06 MED ORDER — CEFTRIAXONE SODIUM 250 MG IJ SOLR
250.0000 mg | Freq: Once | INTRAMUSCULAR | Status: AC
  Administered 2018-06-06: 250 mg via INTRAMUSCULAR

## 2018-06-06 MED ORDER — CEFTRIAXONE SODIUM 250 MG IJ SOLR
INTRAMUSCULAR | Status: AC
  Filled 2018-06-06: qty 250

## 2018-06-06 MED ORDER — AZITHROMYCIN 250 MG PO TABS
ORAL_TABLET | ORAL | Status: AC
  Filled 2018-06-06: qty 4

## 2018-06-06 MED ORDER — LIDOCAINE HCL (PF) 1 % IJ SOLN
INTRAMUSCULAR | Status: AC
  Filled 2018-06-06: qty 2

## 2018-06-06 NOTE — ED Provider Notes (Signed)
[facility]   [id number] 06/06/18 Arrival Time: 1026   CC: dysuria and penile discharge  SUBJECTIVE:  Edward Petty is a 28 y.o. male who presents with dysuria and penile discharge x 1-2 days.  Denies a precipitating event.  Last unprotected sex approximately 1 week ago.  Sexually active with 1 male partner.  Describes discharge as clear and white.  Worse with urination.  Denies similar symptoms in the past.  Denies fever, chills, nausea, vomiting, abdominal or pelvic pain, penile rashes or lesions, testicular swelling or pain.     No LMP for male patient.  ROS: As per HPI.  History reviewed. No pertinent past medical history. History reviewed. No pertinent surgical history. No Known Allergies No current facility-administered medications on file prior to encounter.    No current outpatient medications on file prior to encounter.   Social History   Socioeconomic History  . Marital status: Single    Spouse name: Not on file  . Number of children: Not on file  . Years of education: Not on file  . Highest education level: Not on file  Occupational History  . Not on file  Social Needs  . Financial resource strain: Not on file  . Food insecurity:    Worry: Not on file    Inability: Not on file  . Transportation needs:    Medical: Not on file    Non-medical: Not on file  Tobacco Use  . Smoking status: Current Every Day Smoker    Packs/day: 1.00  . Smokeless tobacco: Never Used  Substance and Sexual Activity  . Alcohol use: Yes    Comment: occasionally  . Drug use: No    Comment: molly   . Sexual activity: Yes    Birth control/protection: None  Lifestyle  . Physical activity:    Days per week: Not on file    Minutes per session: Not on file  . Stress: Not on file  Relationships  . Social connections:    Talks on phone: Not on file    Gets together: Not on file    Attends religious service: Not on file    Active member of club or organization: Not on  file    Attends meetings of clubs or organizations: Not on file    Relationship status: Not on file  . Intimate partner violence:    Fear of current or ex partner: Not on file    Emotionally abused: Not on file    Physically abused: Not on file    Forced sexual activity: Not on file  Other Topics Concern  . Not on file  Social History Narrative  . Not on file   History reviewed. No pertinent family history.  OBJECTIVE:  Vitals:   06/06/18 1110  BP: 134/74  Pulse: 64  Temp: 98.5 F (36.9 C)  TempSrc: Temporal  SpO2: 100%     General appearance: alert, NAD, appears stated age Head: NCAT Throat: lips, mucosa, and tongue normal; teeth and gums normal Lungs: CTA bilaterally without adventitious breath sounds Heart: regular rate and rhythm.  Radial pulses 2+ symmetrical bilaterally Back: no CVA tenderness Abdomen: soft, non-tender; bowel sounds normal; no guarding GU: deferred Skin: warm and dry Psychological:  Alert and cooperative. Normal mood and affect.  ASSESSMENT & PLAN:  1. Dysuria   2. Urethral discharge in male   3. Concern about STD in male without diagnosis     Meds ordered this encounter  Medications  . cefTRIAXone (ROCEPHIN) injection  250 mg  . azithromycin (ZITHROMAX) tablet 1,000 mg    Pending: Labs Reviewed  URINE CYTOLOGY ANCILLARY ONLY    Given rocephin 250mg  injection and azithromycin 1g in office Urine cytology obtained  Declines HIV/ syphilis testing today We will follow up with you regarding the results of your test If tests are positive, please abstain from sexual activity for at least 7 days and notify partners Follow up with PCP or Community health if symptoms persists Return here or go to ER if you have any new or worsening symptoms fever, chills, nausea, vomiting, abdominal pain, testicular pain or swelling, persistent symptoms despite treatment, etc...  Reviewed expectations re: course of current medical issues. Questions  answered. Outlined signs and symptoms indicating need for more acute intervention. Patient verbalized understanding. After Visit Summary given.       Rennis Harding, PA-C 06/06/18 1150

## 2018-06-06 NOTE — ED Triage Notes (Signed)
Pt reports burning with urination and penile discharge x2 days.  Pt denies any other symptoms.

## 2018-06-06 NOTE — Discharge Instructions (Addendum)
Given rocephin 250mg  injection and azithromycin 1g in office Urine cytology obtained  Declines HIV/ syphilis testing today We will follow up with you regarding the results of your test If tests are positive, please abstain from sexual activity for at least 7 days and notify partners Follow up with PCP or [facility] if symptoms persists Return here or go to ER if you have any new or worsening symptoms fever, chills, nausea, vomiting, abdominal pain, testicular pain or swelling, persistent symptoms despite treatment, etc..Marland Kitchen

## 2018-06-07 LAB — URINE CYTOLOGY ANCILLARY ONLY
CHLAMYDIA, DNA PROBE: NEGATIVE
Neisseria Gonorrhea: POSITIVE — AB
TRICH (WINDOWPATH): NEGATIVE

## 2018-06-09 ENCOUNTER — Telehealth (HOSPITAL_COMMUNITY): Payer: Self-pay | Admitting: Emergency Medicine

## 2018-06-09 NOTE — Telephone Encounter (Signed)
Test for gonorrhea was positive. This was treated at the urgent care visit with IM rocephin 250mg  and po zithromax 1g. Pt needs education to refrain from sexual intercourse for 7 days after treatment to give the medicine time to work. Sexual partners need to be notified and tested/treated. Condoms may reduce risk of reinfection. Recheck or followup with PCP for further evaluation if symptoms are not improving. [facility] notified.   Patient contacted and made aware, all questions answered.
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
## 2021-10-26 ENCOUNTER — Encounter (HOSPITAL_COMMUNITY): Payer: Self-pay | Admitting: *Deleted

## 2021-10-26 ENCOUNTER — Ambulatory Visit (HOSPITAL_COMMUNITY)
Admission: EM | Admit: 2021-10-26 | Discharge: 2021-10-26 | Disposition: A | Discharge status: Home / Self Care | Payer: Medicaid Other | Attending: Emergency Medicine | Admitting: Emergency Medicine

## 2021-10-26 ENCOUNTER — Other Ambulatory Visit: Payer: Self-pay

## 2021-10-26 DIAGNOSIS — R21 Rash and other nonspecific skin eruption: Secondary | ICD-10-CM

## 2021-10-26 MED ORDER — PREDNISONE 10 MG (21) PO TBPK: ORAL_TABLET | Freq: Every day | ORAL | 0 refills | Status: AC

## 2021-10-26 MED ORDER — CEPHALEXIN 500 MG PO CAPS: 500.0000 mg | ORAL_CAPSULE | Freq: Two times a day (BID) | ORAL | 0 refills | Status: AC

## 2021-10-26 NOTE — ED Provider Notes (Signed)
[employer]    CSN: [id number] Arrival date & time: 10/26/21  1011      History   Chief Complaint Chief Complaint  Patient presents with   break out    HPI Edward Petty is a 31 y.o. male.   Presents with rash to the right side of back right arm and into the right flank and abdomen beginning 4 days ago.  Rash is not pruritic, denies pain, drainage.  Denies changes in soaps, lotions detergents or recent travel.  No other members of household has rash.  Not attempted treatment of symptoms.   History reviewed. No pertinent past medical history.  There are no problems to display for this patient.   History reviewed. No pertinent surgical history.     Home Medications    Prior to Admission medications   Not on File    Family History History reviewed. No pertinent family history.  Social History Social History   Tobacco Use   Smoking status: Every Day    Packs/day: 1.00    Types: Cigarettes   Smokeless tobacco: Never  Substance Use Topics   Alcohol use: Yes    Comment: occasionally   Drug use: No    Comment: molly      Allergies   Patient has no known allergies.   Review of Systems Review of Systems  Constitutional: Negative.   Respiratory: Negative.    Cardiovascular: Negative.   Gastrointestinal: Negative.   Skin:  Positive for rash. Negative for color change, pallor and wound.  Neurological: Negative.     Physical Exam Triage Vital Signs ED Triage Vitals  Enc Vitals Group     BP 10/26/21 1145 (!) 152/96     Pulse Rate 10/26/21 1145 (!) 56     Resp 10/26/21 1145 18     Temp 10/26/21 1145 98.4 F (36.9 C)     Temp src --      SpO2 10/26/21 1145 98 %     Weight --      Height --      Head Circumference --      Peak Flow --      Pain Score 10/26/21 1144 0     Pain Loc --      Pain Edu? --      Excl. in [place]? --    No data found.  Updated Vital Signs BP (!) 152/96   Pulse (!) 56   Temp 98.4 F (36.9 C)   Resp 18   SpO2  98%   Visual Acuity Right Eye Distance:   Left Eye Distance:   Bilateral Distance:    Right Eye Near:   Left Eye Near:    Bilateral Near:     Physical Exam Constitutional:      Appearance: Normal appearance.  HENT:     Head: Normocephalic.  Eyes:     Extraocular Movements: Extraocular movements intact.  Pulmonary:     Effort: Pulmonary effort is normal.  Musculoskeletal:     Comments: Flesh tone papules with scab-like centers present on the right side of the upper and lower back to the right flank the right upper abdomen and the right forearm  Skin:    General: Skin is warm and dry.  Neurological:     Mental Status: He is alert and oriented to person, place, and time. Mental status is at baseline.  Psychiatric:        Mood and Affect: Mood normal.  Behavior: Behavior normal.     UC Treatments / Results  Labs (all labs ordered are listed, but only abnormal results are displayed) Labs Reviewed - No data to display  EKG   Radiology No results found.  Procedures Procedures (including critical care time)  Medications Ordered in UC Medications - No data to display  Initial Impression / Assessment and Plan / UC Course  I have reviewed the triage vital signs and the nursing notes.  Pertinent labs & imaging results that were available during my care of the patient were reviewed by me and considered in my medical decision making (see chart for details).  Rash  Unknown etiology, not typical presentation for the most common rashes, Discussed with patient, will cover for bacteria and inflammation, prednisone 60 mg taper and Keflex 5-day course prescribed for outpatient treatment, may follow-up with urgent care for persisting or worsening symptoms Final Clinical Impressions(s) / UC Diagnoses   Final diagnoses:  None   Discharge Instructions   None    ED Prescriptions   None    PDMP not reviewed this encounter.   Hans Eden, [place] 10/26/21 1218

## 2021-10-26 NOTE — Discharge Instructions (Addendum)
Severe rash is unknown at this time therefore we will move forward with treating the most common symptoms which are infection and inflammation  Begin use of prednisone every morning as directed on packaging, this medicine will reduce any inflammation  Begin taking Keflex twice daily for the next 5 days, this medicine will cover for bacteria  If your symptoms continue to persist o or worsen you may follow-up with urgent care as needed for reevaluation and management

## 2021-10-26 NOTE — ED Triage Notes (Signed)
PT reports a break out all over but mainly on back and arm. Pt first noticed break out on Tuesday.

## 2021-11-19 ENCOUNTER — Other Ambulatory Visit: Payer: Self-pay

## 2021-11-19 ENCOUNTER — Encounter (HOSPITAL_COMMUNITY): Payer: Self-pay | Admitting: Emergency Medicine

## 2021-11-19 ENCOUNTER — Emergency Department (HOSPITAL_COMMUNITY)
Admission: EM | Admit: 2021-11-19 | Discharge: 2021-11-19 | Disposition: A | Discharge status: Home / Self Care | Payer: Medicaid Other | Attending: Emergency Medicine | Admitting: Emergency Medicine

## 2021-11-19 DIAGNOSIS — R21 Rash and other nonspecific skin eruption: Secondary | ICD-10-CM | POA: Insufficient documentation

## 2021-11-19 DIAGNOSIS — L309 Dermatitis, unspecified: Secondary | ICD-10-CM | POA: Insufficient documentation

## 2021-11-19 MED ORDER — HYDROCORTISONE 1 % EX CREA: TOPICAL_CREAM | CUTANEOUS | 0 refills | Status: AC

## 2021-11-19 NOTE — ED Provider Notes (Signed)
[facility] EMERGENCY DEPARTMENT Provider Note   CSN: [id number] Arrival date & time: 11/19/21  1884     History  Chief Complaint  Patient presents with   Rash    Edward Petty is a 31 y.o. male.   Rash   31 year old male presenting to the emergency department for a papular rash that spares the palms and soles that has been present on his arms bilaterally and his torso for the past 3 weeks.  The patient was initially seen in urgent care and started on a antibiotic and steroid course which did not improve symptoms.  He has been trying hydrocortisone cream over-the-counter and Benadryl for itching with no relief.  He denies any involvement of the palms or soles of his feet.  He denies any involvement of the mucous membranes.  He denies any fever.  He denies any exposure to new medications prior to eruption of the rash.  He denies any new detergents or other potential new irritants for his skin.  Home Medications Prior to Admission medications   Medication Sig Start Date End Date Taking? Authorizing Provider  hydrocortisone cream 1 % Apply to affected areas 2 times daily 11/19/21  Yes Regan Lemming, MD  predniSONE (STERAPRED UNI-PAK 21 TAB) 10 MG (21) TBPK tablet Take by mouth daily. Take 6 tabs by mouth daily  for 2 days, then 5 tabs for 2 days, then 4 tabs for 2 days, then 3 tabs for 2 days, 2 tabs for 2 days, then 1 tab by mouth daily for 2 days 10/26/21   Hans Eden, NP      Allergies    Patient has no known allergies.    Review of Systems   Review of Systems  Skin:  Positive for rash.  All other systems reviewed and are negative.   Physical Exam Updated Vital Signs BP 131/90   Pulse 80   Temp 98.2 F (36.8 C) (Oral)   Resp 16   SpO2 100%  Physical Exam Vitals and nursing note reviewed.  Constitutional:      General: He is not in acute distress. HENT:     Head: Normocephalic and atraumatic.  Eyes:     Conjunctiva/sclera: Conjunctivae  normal.     Pupils: Pupils are equal, round, and reactive to light.  Cardiovascular:     Rate and Rhythm: Normal rate and regular rhythm.  Pulmonary:     Effort: Pulmonary effort is normal. No respiratory distress.  Abdominal:     General: There is no distension.     Tenderness: There is no guarding.  Musculoskeletal:        General: No deformity or signs of injury.     Cervical back: Neck supple.  Skin:    Findings: Rash present. No lesion.     Comments: Diffuse papular rash involving the bilateral upper extremities, torso, sparing the mucous membranes and sparing the palms of the hands  Neurological:     General: No focal deficit present.     Mental Status: He is alert. Mental status is at baseline.     ED Results / Procedures / Treatments   Labs (all labs ordered are listed, but only abnormal results are displayed) Labs Reviewed - No data to display  EKG None  Radiology No results found.  Procedures Procedures    Medications Ordered in ED Medications - No data to display  ED Course/ Medical Decision Making/ A&P  Medical Decision Making  31 year old male presenting to the emergency department for a papular rash that spares the palms and soles that has been present on his arms bilaterally and his torso for the past 3 weeks.  The patient was initially seen in urgent care and started on a antibiotic and steroid course which did not improve symptoms.  He has been trying hydrocortisone cream over-the-counter and Benadryl for itching with no relief.  He denies any involvement of the palms or soles of his feet.  He denies any involvement of the mucous membranes.  He denies any fever.  He denies any exposure to new medications prior to eruption of the rash.  He denies any new detergents or other potential new irritants for his skin.  On arrival, the patient was vitally stable.  The patient's physical exam was concerning for a diffuse papular rash  involving the bilateral upper extremities, torso, sparing the mucous membranes and sparing the palms of the hands.  Patient denies any mucous membrane involvement, denies any fevers or chills.  He denies any new possible irritants.  He denies any medication use prior to initiation of the rash which has been ongoing for the last 3 weeks.  Differential Diagnoses: There are no red flag symptoms such as fever, mucosal involvement, hypotension, toxic appearance, severe pain, immunosuppression, or a concerning medication being used. There are no physical exam findings or symptoms on ROS concerning for erythema multiforme, SJS/TEN, Lyme disease, cellulitis, necrotizing fasciitis, purupra fulminans, angioedema, anaphylaxis, meningococcemia, DRESS, or RMSF. I do not think that the patient is septic.  I believe the patient is safe for discharge home. The patient feels safe with this plan. I prescribed hydrocortisone and provided a referral to dermatology. They agreed to followup with their PCP and dermatology.   Final Clinical Impression(s) / ED Diagnoses Final diagnoses:  Rash  Eczema, unspecified type    Rx / DC Orders ED Discharge Orders          Ordered    hydrocortisone cream 1 %        11/19/21 1228    Ambulatory referral to Dermatology        11/19/21 1228              Regan Lemming, MD 11/19/21 1231

## 2021-11-19 NOTE — ED Triage Notes (Signed)
Pt states he has had a whole body rash for two weeks. Pt states he went to urgent care a week ago and was given a steroid but says it made it worse. Pt states the rash is now starting to itch. No rash on his face/mouth.

## 2022-03-27 NOTE — Progress Notes (Signed)
 [address] [place], [place] [address] [address] , [address] [place], [place] [address]    CURRENT URGENT CARE ENCOUNTER ORDERS Orders Placed This Encounter  Procedures  . GC/Chlamydia Amp    Standing Status:   Future    Standing Expiration Date:   06/02/2023    Order Specific Question:   Result Release to patient:    Answer:   Immediate   _____________________________________________________________________  CHIEF COMPLAINT   Chief Complaint  Patient presents with  . Penile Discharge    Pt c/o penile discharge for 2 days.   Patient presents complaining of a penile drainage for the last 2 days.  States his partner told him yesterday that they tested positive for gonorrhea.  Patient states his discharge has been clear.  has been no discomfort.  No fever.  Patient has not done anything for his symptoms.    Review of Systems:  General: No fever, no chills, no sweats. no weakness, no fatigue. Skin: No rash. HEENT: No ear pain, no sore throat, no change in vision. Respiratory: No shortness of breath, no cough. Cardiovascular: No chest pain, no palpitations, no extremity edema. GI: No abdominal pain, no nausea, no vomiting, no diarrhea. GU: No dysuria. MS: atraumatic, no decrease in range of motion Neurologic: No headache, No dizziness. Pyschiatric: No confusion Any other systems reviewed were non-contributory.  Records Review: Previous medical records have been read, reviewed and appreciated, if available for review; current nursing notes and previous provider notes have been read, reviewed and appreciated as well.  Chart Review: Allergies, Current Medications, Past Medical History, Past Surgical History, Family History, Social History and the Current Active Problem List has been read, reviewed and appreciated.  PAST MEDICAL HISTORY   History reviewed. No pertinent past medical history.  Immunization History  Administered Date(s) Administered  . Tdap  (ADACEL, BOOSTRIX ) 02/10/2018    SURGICAL HISTORY   History reviewed. No pertinent surgical history.  CURRENT MEDICATIONS   Current Outpatient Medications:  .  doxycycline hyclate (DORYX) 100 MG DR tablet, Take 1 tablet (100 mg total) by mouth 2 times daily., Disp: 14 tablet, Rfl: 0  Current Facility-Administered Medications:  .  cefTRIAXone  (ROCEPHIN ) injection 500 mg, 500 mg, Intramuscular, Once, Judi Sieving, DO   ALLERGIES   Allergies  Allergen Reactions  . Pollen Extracts Other (See Comments)    Seasonal allergy symptoms    FAMILY HISTORY   History reviewed. No pertinent family history.  SOCIAL HISTORY   Social History   Tobacco Use  Smoking Status Every Day  . Types: Cigarettes  Smokeless Tobacco Never   Social History   Substance and Sexual Activity  Alcohol Use None   Social History   Substance and Sexual Activity  Drug Use Not on file    PHYSICAL EXAM   Vital Signs:  BP 153/90   Pulse 66   Temp 97.8 F (36.6 C) (Temporal)   Resp 16   Ht 1.803 m (5' 11)   Wt 96.6 kg (213 lb)   SpO2 97%   BMI 29.71 kg/m    General: The patient appears well, alert, oriented x 3, in no acute distress. HEENT: PERLA, EOMI HEAD: normocephalic, atraumatic NECK: supple, no meningeal signs. RESPIRATORY: clear, no wheezes, rhonchi, or or rales.  No respiratory distress. CARDIAC: S2 and S2  normal, no murmurs. Regular rate and rhythm. ABDOMEN: soft, active bowel sounds, no tenderness, no guarding, no rebound.  Genital exam deferred EXTREMITIES: No edema. No deformity. Range  of motion intact. NEUROLOGICAL: orientation/mental status grossly normal, No obvious sensory or motor deficits. SKIN: Warm, dry PSYCHIATRIC: Alert, cooperative  Results: No results found for this or any previous visit (from the past 24 hour(s)).       MDM: 153/90 Urinalysis shows 2+ leukocytes.  Patient definitely has a UTI.  Suspect he does have chlamydia and gonorrhea based on the  fact that his partner tested positive yesterday and did call him so.  We will send off GC and chlamydia but will treat in the interim with IM Rocephin  and Flagyl   FINAL IMPRESSIONS    1. Urinary tract infection without hematuria, site unspecified   2. STD (male)     Problem List With Orders Addressed This Visit:  Problem List Items Addressed This Visit   None Visit Diagnoses    Urinary tract infection without hematuria, site unspecified    -  Primary   Relevant Orders   GC/Chlamydia Amp   STD (male)       Relevant Orders   GC/Chlamydia Amp        Requested Consultations: The patient will follow up with their current PCP as advised.   The patient may need specialty follow up if the symptoms continue, in spite of conservative treatment and management, for further workup, evaluation, consultation and treatment as clinically indicated and appropriate.  Routine symptom specific, illness specific and/or disease specific instructions were discussed with the patient and/or caregiver at length; Patient presented with an acute illness with associated systemic symptoms and significant discomfort requiring acute urgent management.  In my opinion, this is a condition that a prudent lay person (someone who possesses an average knowledge of health and medicine) may potentially expect to result in serious jeopardy, cause serious impairment of bodily function or result in serious dysfunction of bodily organs.  As such, the patient has been evaluated and assessed; workup and treatment have begun; but the patient may require follow up for further testing and treatment if the symptoms continue in spite of treatment, as clinically indicated and appropriate.  Counselled regarding currently diagnosed medical/surgical condition(s) and all patient/family questions answered to their satisfaction and understanding was subsequently voiced.  Correct use information and potential side effects of new medication(s)  were discussed at length. Reviewed worrisome signs and symptoms to watch for; patient/family instructed to call, return to office or go directly to the Emergency Department for any worsening, prolonged, changing symptoms or if new symptoms develop. I have discussed the signs and symptoms that would warrant Emergency Department evaluation. The patient/family has been advised to follow up with their primary care provider if symptoms persist.  Return to the [facility] or PCP in 3-5 days if no better; to PCP or the Emergency Department if new signs and symptoms develop, or if the current signs or symptoms continue to change or worsen for further workup, evaluation and treatment as clinically indicated and appropriate   Return to care should the presenting symptoms recur, persist, or worsen in any way; or in the alternative, if new symptoms or complaints develop.  Condition: stable for discharge home Home: take medications as prescribed; routine discharge instructions as discussed; follow up as advised.  The patient and any family present were given verbal and written discharge instructions as clinically indicated and appropriate; they expressed understanding and agreement to the instructions as given.  This electronic health record was dictated using Conservation officer, historic buildings. Although it was checked for errors, there may be errors related to utilization of  this dictation software.  Electronically signed by: Toribio Buss, DO 03/27/2022 10:38 AM    Electronically signed by: Buss Toribio, DO 03/27/22 1043

## 2022-03-31 NOTE — Telephone Encounter (Signed)
 Gave Pt results and sent form to [facility].   Electronically signed by: Neysa Planas, RT 03/31/22 1818

## 2022-04-28 NOTE — Progress Notes (Signed)
 Patient ID: Edward Petty is a 31 y.o. male. Patient reports partner was diagnosed with gonorhea. Pt den    Allergies  Allergen Reactions  . Pollen Extracts Other (See Comments)    Seasonal allergy symptoms    There is no problem list on file for this patient.    History of Present Illness: HPI  31 year old male presents today with complaints of known exposure to gonorrhea.  Patient states that his partner was here 3 days ago and tested positive for gonorrhea.  Patient denies any current symptoms at this time.  Patient is here for treatment.  Review of Systems: Review of Systems  Constitutional: Negative for chills, diaphoresis, fatigue and fever.  Gastrointestinal: Negative for nausea and vomiting.  Genitourinary: Negative for dysuria, genital sores, penile discharge and penile pain.     Objective: Vitals:   04/28/22 0949  BP: 141/97  Pulse: 70  Temp: 97.8 F (36.6 C)  Resp: 20  SpO2: 100%  Position: Sitting         Physical Exam: Physical Exam Vitals and nursing note reviewed.  Constitutional:      General: He is not in acute distress.    Appearance: Normal appearance.  HENT:     Head: Normocephalic and atraumatic.     Nose: Nose normal. No congestion.     Mouth/Throat:     Mouth: Mucous membranes are moist.  Eyes:     Conjunctiva/sclera: Conjunctivae normal.  Pulmonary:     Effort: Pulmonary effort is normal.  Musculoskeletal:        General: Normal range of motion.     Cervical back: Normal range of motion.  Skin:    General: Skin is warm and dry.  Neurological:     Mental Status: He is alert and oriented to person, place, and time.     Labs/Imaging: No results found for this or any previous visit (from the past 24 hour(s)).    Assessment/Plan: Jami was seen today for patient reports partner was diagnosed with gonorhea. pt den.  Diagnoses and all orders for this visit:  STD exposure -     cefTRIAXone  (ROCEPHIN ) injection 500 mg    Will  treat with rocephin  500 mg IM due to known exposure to gonorrhea Recommend follow-up with health department in 2 weeks for test of cure recommend Avoiding intercourse until he gets a negative test of cure    Patient has been instructed on medications, dosages, side effects, and possible interactions as associated with each diagnosis in my impression and plan above.   Patient education (verbal/handout) given on diagnosis, pathophysiology, treatment of diagnosis, side effects of medication use for treatment, restrictions while        taking medication, supportive measures such as pushing fluids, using OTC medications,   Patient was instructed on when to follow up and know that they can follow up here, with their PCP, Urgent Care, ED.  They have been instructed that if symptoms      worse that should return to the clinic, go to the nearest ED, or activate EMS.   Patient was given AVS.  This was reviewed with them.  Red Flags associated with their diagnoses were reviewed and patient was educated on what to do if red       flags develop.  Patient agreed with plan and voiced understanding.  NO barriers to adherence perceived by myself.     Urgent Care Follow Up: Home Care   Portions of this note may have been  dictated using Engineer, materials and may contain grammatical or spelling errors.   Electronically signed by Waddell Rocky Ada, PA-C at 10:33 AM.   Electronically signed by: Ada Waddell Rocky, PA-C 04/28/22 1034

## 2022-04-28 NOTE — Progress Notes (Signed)
 IM injection administered, pt observed, no complications.    Electronically signed by: Georgina Waddell Longs, PA-C 04/28/22 1034

## 2023-12-02 ENCOUNTER — Emergency Department (HOSPITAL_COMMUNITY): Payer: No Typology Code available for payment source

## 2023-12-02 ENCOUNTER — Encounter (HOSPITAL_COMMUNITY): Admission: EM | Discharge status: Against Medical Advice | Payer: Self-pay | Source: Home / Self Care

## 2023-12-02 ENCOUNTER — Inpatient Hospital Stay (HOSPITAL_COMMUNITY)
Admission: EM | Admit: 2023-12-02 | Discharge: 2023-12-08 | DRG: 580 | Discharge status: Against Medical Advice | Payer: No Typology Code available for payment source | Attending: General Surgery | Admitting: General Surgery

## 2023-12-02 ENCOUNTER — Emergency Department (HOSPITAL_COMMUNITY): Payer: No Typology Code available for payment source | Admitting: Certified Registered Nurse Anesthetist

## 2023-12-02 DIAGNOSIS — Z9889 Other specified postprocedural states: Secondary | ICD-10-CM

## 2023-12-02 DIAGNOSIS — W3400XA Accidental discharge from unspecified firearms or gun, initial encounter: Secondary | ICD-10-CM

## 2023-12-02 DIAGNOSIS — S31139A Puncture wound of abdominal wall without foreign body, unspecified quadrant without penetration into peritoneal cavity, initial encounter: Principal | ICD-10-CM

## 2023-12-02 DIAGNOSIS — N179 Acute kidney failure, unspecified: Secondary | ICD-10-CM | POA: Diagnosis present

## 2023-12-02 DIAGNOSIS — M79671 Pain in right foot: Secondary | ICD-10-CM | POA: Diagnosis present

## 2023-12-02 DIAGNOSIS — R000 Tachycardia, unspecified: Secondary | ICD-10-CM | POA: Diagnosis present

## 2023-12-02 DIAGNOSIS — M21372 Foot drop, left foot: Secondary | ICD-10-CM | POA: Diagnosis present

## 2023-12-02 DIAGNOSIS — K458 Other specified abdominal hernia without obstruction or gangrene: Secondary | ICD-10-CM | POA: Diagnosis present

## 2023-12-02 DIAGNOSIS — S7402XA Injury of sciatic nerve at hip and thigh level, left leg, initial encounter: Secondary | ICD-10-CM | POA: Diagnosis present

## 2023-12-02 DIAGNOSIS — S31134A Puncture wound of abdominal wall without foreign body, left lower quadrant without penetration into peritoneal cavity, initial encounter: Principal | ICD-10-CM | POA: Diagnosis present

## 2023-12-02 DIAGNOSIS — S80811A Abrasion, right lower leg, initial encounter: Secondary | ICD-10-CM | POA: Diagnosis present

## 2023-12-02 DIAGNOSIS — S31109A Unspecified open wound of abdominal wall, unspecified quadrant without penetration into peritoneal cavity, initial encounter: Secondary | ICD-10-CM | POA: Diagnosis not present

## 2023-12-02 DIAGNOSIS — Y9289 Other specified places as the place of occurrence of the external cause: Secondary | ICD-10-CM

## 2023-12-02 DIAGNOSIS — R030 Elevated blood-pressure reading, without diagnosis of hypertension: Secondary | ICD-10-CM | POA: Diagnosis present

## 2023-12-02 DIAGNOSIS — M79652 Pain in left thigh: Secondary | ICD-10-CM | POA: Diagnosis present

## 2023-12-02 DIAGNOSIS — G589 Mononeuropathy, unspecified: Secondary | ICD-10-CM | POA: Diagnosis present

## 2023-12-02 DIAGNOSIS — S40812A Abrasion of left upper arm, initial encounter: Secondary | ICD-10-CM | POA: Diagnosis present

## 2023-12-02 DIAGNOSIS — S80812A Abrasion, left lower leg, initial encounter: Secondary | ICD-10-CM | POA: Diagnosis present

## 2023-12-02 DIAGNOSIS — S40811A Abrasion of right upper arm, initial encounter: Secondary | ICD-10-CM | POA: Diagnosis present

## 2023-12-02 DIAGNOSIS — D62 Acute posthemorrhagic anemia: Secondary | ICD-10-CM | POA: Diagnosis present

## 2023-12-02 DIAGNOSIS — S71132A Puncture wound without foreign body, left thigh, initial encounter: Secondary | ICD-10-CM | POA: Diagnosis present

## 2023-12-02 DIAGNOSIS — Z5329 Procedure and treatment not carried out because of patient's decision for other reasons: Secondary | ICD-10-CM | POA: Diagnosis not present

## 2023-12-02 DIAGNOSIS — F17210 Nicotine dependence, cigarettes, uncomplicated: Secondary | ICD-10-CM | POA: Diagnosis present

## 2023-12-02 DIAGNOSIS — Z189 Retained foreign body fragments, unspecified material: Secondary | ICD-10-CM

## 2023-12-02 DIAGNOSIS — R531 Weakness: Secondary | ICD-10-CM | POA: Diagnosis present

## 2023-12-02 DIAGNOSIS — Y249XXA Unspecified firearm discharge, undetermined intent, initial encounter: Secondary | ICD-10-CM | POA: Diagnosis present

## 2023-12-02 DIAGNOSIS — Z23 Encounter for immunization: Secondary | ICD-10-CM

## 2023-12-02 DIAGNOSIS — T797XXA Traumatic subcutaneous emphysema, initial encounter: Secondary | ICD-10-CM | POA: Diagnosis present

## 2023-12-02 HISTORY — PX: LAPAROTOMY: SHX154

## 2023-12-02 LAB — I-STAT CHEM 8, ED
BUN: 12 mg/dL (ref 6–20)
Calcium, Ion: 1.01 mmol/L — ABNORMAL LOW (ref 1.15–1.40)
Chloride: 112 mmol/L — ABNORMAL HIGH (ref 98–111)
Creatinine, Ser: 1.8 mg/dL — ABNORMAL HIGH (ref 0.61–1.24)
Glucose, Bld: 121 mg/dL — ABNORMAL HIGH (ref 70–99)
HCT: 45 % (ref 39.0–52.0)
Hemoglobin: 15.3 g/dL (ref 13.0–17.0)
Potassium: 3.7 mmol/L (ref 3.5–5.1)
Sodium: 142 mmol/L (ref 135–145)
TCO2: 10 mmol/L — ABNORMAL LOW (ref 22–32)

## 2023-12-02 LAB — CBC
HCT: 43.5 % (ref 39.0–52.0)
Hemoglobin: 13.8 g/dL (ref 13.0–17.0)
MCH: 32 pg (ref 26.0–34.0)
MCHC: 31.7 g/dL (ref 30.0–36.0)
MCV: 100.9 fL — ABNORMAL HIGH (ref 80.0–100.0)
Platelets: 247 K/uL (ref 150–400)
RBC: 4.31 MIL/uL (ref 4.22–5.81)
RDW: 13.7 % (ref 11.5–15.5)
WBC: 8.9 K/uL (ref 4.0–10.5)
nRBC: 0 % (ref 0.0–0.2)

## 2023-12-02 LAB — I-STAT CG4 LACTIC ACID, ED: Lactic Acid, Venous: 13.5 mmol/L (ref 0.5–1.9)

## 2023-12-02 LAB — SAMPLE TO BLOOD BANK

## 2023-12-02 LAB — PROTIME-INR
INR: 1.1 (ref 0.8–1.2)
Prothrombin Time: 14.9 s (ref 11.4–15.2)

## 2023-12-02 LAB — ETHANOL: Alcohol, Ethyl (B): 149 mg/dL — ABNORMAL HIGH (ref <15)

## 2023-12-02 SURGERY — LAPAROTOMY, EXPLORATORY
Anesthesia: General

## 2023-12-02 MED ORDER — FENTANYL CITRATE (PF) 250 MCG/5ML IJ SOLN
INTRAMUSCULAR | Status: AC
  Filled 2023-12-02: qty 5

## 2023-12-02 MED ORDER — TETANUS-DIPHTH-ACELL PERTUSSIS 5-2.5-18.5 LF-MCG/0.5 IM SUSY
0.5000 mL | PREFILLED_SYRINGE | Freq: Once | INTRAMUSCULAR | Status: AC
  Administered 2023-12-02: 0.5 mL via INTRAMUSCULAR

## 2023-12-02 MED ORDER — IOHEXOL 350 MG/ML SOLN
75.0000 mL | Freq: Once | INTRAVENOUS | Status: AC | PRN
  Administered 2023-12-02: 75 mL via INTRAVENOUS

## 2023-12-02 MED ORDER — PROPOFOL 10 MG/ML IV BOLUS
INTRAVENOUS | Status: AC
  Filled 2023-12-02: qty 20

## 2023-12-02 MED ORDER — SODIUM CHLORIDE 0.9 % IV BOLUS
10.0000 mL/kg | Freq: Once | INTRAVENOUS | Status: AC
  Administered 2023-12-02: 975 mL via INTRAVENOUS

## 2023-12-02 MED ORDER — MIDAZOLAM HCL 2 MG/2ML IJ SOLN
INTRAMUSCULAR | Status: AC
  Filled 2023-12-02: qty 2

## 2023-12-02 SURGICAL SUPPLY — 42 items
BAG COUNTER SPONGE SURGICOUNT (BAG) ×1 IMPLANT
BLADE CLIPPER SURG (BLADE) IMPLANT
CANISTER SUCTION 3000ML PPV (SUCTIONS) ×1 IMPLANT
CHLORAPREP W/TINT 26 (MISCELLANEOUS) ×1 IMPLANT
COVER SURGICAL LIGHT HANDLE (MISCELLANEOUS) ×1 IMPLANT
DRAPE LAPAROSCOPIC ABDOMINAL (DRAPES) ×1 IMPLANT
DRAPE WARM FLUID 44X44 (DRAPES) ×1 IMPLANT
DRSG OPSITE POSTOP 4X10 (GAUZE/BANDAGES/DRESSINGS) IMPLANT
DRSG OPSITE POSTOP 4X8 (GAUZE/BANDAGES/DRESSINGS) IMPLANT
ELECT BLADE 6.5 EXT (BLADE) IMPLANT
ELECT CAUTERY BLADE 6.4 (BLADE) ×1 IMPLANT
ELECTRODE REM PT RTRN 9FT ADLT (ELECTROSURGICAL) ×1 IMPLANT
GAUZE SPONGE 4X4 12PLY STRL (GAUZE/BANDAGES/DRESSINGS) IMPLANT
GLOVE BIOGEL PI MICRO STRL 6 (GLOVE) ×1 IMPLANT
GLOVE INDICATOR 6.5 STRL GRN (GLOVE) ×1 IMPLANT
GLOVE SURG SS PI 6.0 STRL IVOR (GLOVE) IMPLANT
GLOVE SURG SS PI 7.0 STRL IVOR (GLOVE) IMPLANT
GOWN STRL REUS W/ TWL LRG LVL3 (GOWN DISPOSABLE) ×2 IMPLANT
HANDLE SUCTION POOLE (INSTRUMENTS) ×1 IMPLANT
KIT BASIN OR (CUSTOM PROCEDURE TRAY) ×1 IMPLANT
KIT TURNOVER KIT B (KITS) ×1 IMPLANT
LIGASURE IMPACT 36 18CM CVD LR (INSTRUMENTS) IMPLANT
NS IRRIG 1000ML POUR BTL (IV SOLUTION) ×2 IMPLANT
PACK GENERAL/GYN (CUSTOM PROCEDURE TRAY) ×1 IMPLANT
PAD ARMBOARD POSITIONER FOAM (MISCELLANEOUS) ×1 IMPLANT
PENCIL SMOKE EVACUATOR (MISCELLANEOUS) ×1 IMPLANT
SPECIMEN JAR LARGE (MISCELLANEOUS) IMPLANT
SPONGE T-LAP 18X18 ~~LOC~~+RFID (SPONGE) IMPLANT
STAPLER SKIN PROX 35W (STAPLE) ×1 IMPLANT
SUT PDS AB 1 TP1 54 (SUTURE) IMPLANT
SUT PDS AB 1 TP1 96 (SUTURE) ×2 IMPLANT
SUT SILK 2 0 SH CR/8 (SUTURE) IMPLANT
SUT SILK 2-0 18XBRD TIE 12 (SUTURE) IMPLANT
SUT SILK 3 0 SH CR/8 (SUTURE) IMPLANT
SUT SILK 3-0 18XBRD TIE 12 (SUTURE) IMPLANT
SUT VIC AB 2-0 SH 18 (SUTURE) ×1 IMPLANT
SUT VIC AB 3-0 SH 18 (SUTURE) ×1 IMPLANT
SUT VICRYL AB 2 0 TIES (SUTURE) ×1 IMPLANT
SUT VICRYL AB 3 0 TIES (SUTURE) ×1 IMPLANT
TAPE CLOTH SURG 4X10 WHT LF (GAUZE/BANDAGES/DRESSINGS) IMPLANT
TOWEL GREEN STERILE (TOWEL DISPOSABLE) ×1 IMPLANT
TRAY FOLEY MTR SLVR 16FR STAT (SET/KITS/TRAYS/PACK) IMPLANT

## 2023-12-02 NOTE — ED Notes (Signed)
 Patient transported to CT
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
## 2023-12-02 NOTE — ED Notes (Signed)
 PD Case Number: [id number]
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
## 2023-12-03 ENCOUNTER — Encounter (HOSPITAL_COMMUNITY): Payer: Self-pay

## 2023-12-03 ENCOUNTER — Other Ambulatory Visit: Payer: Self-pay

## 2023-12-03 DIAGNOSIS — W3400XA Accidental discharge from unspecified firearms or gun, initial encounter: Secondary | ICD-10-CM

## 2023-12-03 DIAGNOSIS — Z9889 Other specified postprocedural states: Secondary | ICD-10-CM

## 2023-12-03 DIAGNOSIS — M21372 Foot drop, left foot: Secondary | ICD-10-CM | POA: Diagnosis present

## 2023-12-03 DIAGNOSIS — M79652 Pain in left thigh: Secondary | ICD-10-CM | POA: Diagnosis present

## 2023-12-03 DIAGNOSIS — S31134A Puncture wound of abdominal wall without foreign body, left lower quadrant without penetration into peritoneal cavity, initial encounter: Secondary | ICD-10-CM | POA: Diagnosis present

## 2023-12-03 DIAGNOSIS — G589 Mononeuropathy, unspecified: Secondary | ICD-10-CM | POA: Diagnosis present

## 2023-12-03 DIAGNOSIS — Y249XXA Unspecified firearm discharge, undetermined intent, initial encounter: Secondary | ICD-10-CM | POA: Diagnosis present

## 2023-12-03 DIAGNOSIS — K458 Other specified abdominal hernia without obstruction or gangrene: Secondary | ICD-10-CM | POA: Diagnosis present

## 2023-12-03 DIAGNOSIS — S80811A Abrasion, right lower leg, initial encounter: Secondary | ICD-10-CM | POA: Diagnosis present

## 2023-12-03 DIAGNOSIS — R531 Weakness: Secondary | ICD-10-CM | POA: Diagnosis present

## 2023-12-03 DIAGNOSIS — R030 Elevated blood-pressure reading, without diagnosis of hypertension: Secondary | ICD-10-CM | POA: Diagnosis present

## 2023-12-03 DIAGNOSIS — S80812A Abrasion, left lower leg, initial encounter: Secondary | ICD-10-CM | POA: Diagnosis present

## 2023-12-03 DIAGNOSIS — Z5329 Procedure and treatment not carried out because of patient's decision for other reasons: Secondary | ICD-10-CM | POA: Diagnosis not present

## 2023-12-03 DIAGNOSIS — S71132A Puncture wound without foreign body, left thigh, initial encounter: Secondary | ICD-10-CM | POA: Diagnosis present

## 2023-12-03 DIAGNOSIS — S40812A Abrasion of left upper arm, initial encounter: Secondary | ICD-10-CM | POA: Diagnosis present

## 2023-12-03 DIAGNOSIS — Z23 Encounter for immunization: Secondary | ICD-10-CM | POA: Diagnosis not present

## 2023-12-03 DIAGNOSIS — Z189 Retained foreign body fragments, unspecified material: Secondary | ICD-10-CM | POA: Diagnosis not present

## 2023-12-03 DIAGNOSIS — S7402XA Injury of sciatic nerve at hip and thigh level, left leg, initial encounter: Secondary | ICD-10-CM | POA: Diagnosis present

## 2023-12-03 DIAGNOSIS — N179 Acute kidney failure, unspecified: Secondary | ICD-10-CM | POA: Diagnosis present

## 2023-12-03 DIAGNOSIS — S31139A Puncture wound of abdominal wall without foreign body, unspecified quadrant without penetration into peritoneal cavity, initial encounter: Secondary | ICD-10-CM | POA: Diagnosis present

## 2023-12-03 DIAGNOSIS — S40811A Abrasion of right upper arm, initial encounter: Secondary | ICD-10-CM | POA: Diagnosis present

## 2023-12-03 DIAGNOSIS — F17210 Nicotine dependence, cigarettes, uncomplicated: Secondary | ICD-10-CM | POA: Diagnosis present

## 2023-12-03 DIAGNOSIS — R000 Tachycardia, unspecified: Secondary | ICD-10-CM | POA: Diagnosis present

## 2023-12-03 DIAGNOSIS — T797XXA Traumatic subcutaneous emphysema, initial encounter: Secondary | ICD-10-CM | POA: Diagnosis present

## 2023-12-03 DIAGNOSIS — D62 Acute posthemorrhagic anemia: Secondary | ICD-10-CM | POA: Diagnosis present

## 2023-12-03 DIAGNOSIS — Y9289 Other specified places as the place of occurrence of the external cause: Secondary | ICD-10-CM | POA: Diagnosis not present

## 2023-12-03 DIAGNOSIS — M79671 Pain in right foot: Secondary | ICD-10-CM | POA: Diagnosis present

## 2023-12-03 LAB — BASIC METABOLIC PANEL WITH GFR
Anion gap: 10 (ref 5–15)
BUN: 10 mg/dL (ref 6–20)
CO2: 21 mmol/L — ABNORMAL LOW (ref 22–32)
Calcium: 8.5 mg/dL — ABNORMAL LOW (ref 8.9–10.3)
Chloride: 105 mmol/L (ref 98–111)
Creatinine, Ser: 1.28 mg/dL — ABNORMAL HIGH (ref 0.61–1.24)
GFR, Estimated: >60 mL/min (ref >60)
Glucose, Bld: 127 mg/dL — ABNORMAL HIGH (ref 70–99)
Potassium: 4 mmol/L (ref 3.5–5.1)
Sodium: 136 mmol/L (ref 135–145)

## 2023-12-03 LAB — COMPREHENSIVE METABOLIC PANEL WITH GFR
ALT: 25 U/L (ref 0–44)
AST: 32 U/L (ref 15–41)
Albumin: 3.7 g/dL (ref 3.5–5.0)
Alkaline Phosphatase: 92 U/L (ref 38–126)
Anion gap: 22 — ABNORMAL HIGH (ref 5–15)
BUN: 12 mg/dL (ref 6–20)
CO2: 9 mmol/L — ABNORMAL LOW (ref 22–32)
Calcium: 8.4 mg/dL — ABNORMAL LOW (ref 8.9–10.3)
Chloride: 111 mmol/L (ref 98–111)
Creatinine, Ser: 1.71 mg/dL — ABNORMAL HIGH (ref 0.61–1.24)
GFR, Estimated: 54 mL/min — ABNORMAL LOW (ref >60)
Glucose, Bld: 127 mg/dL — ABNORMAL HIGH (ref 70–99)
Potassium: 3.7 mmol/L (ref 3.5–5.1)
Sodium: 142 mmol/L (ref 135–145)
Total Bilirubin: 0.6 mg/dL (ref 0.0–1.2)
Total Protein: 6.4 g/dL — ABNORMAL LOW (ref 6.5–8.1)

## 2023-12-03 LAB — CBC
HCT: 38 % — ABNORMAL LOW (ref 39.0–52.0)
HCT: 38.2 % — ABNORMAL LOW (ref 39.0–52.0)
Hemoglobin: 12.6 g/dL — ABNORMAL LOW (ref 13.0–17.0)
Hemoglobin: 12.8 g/dL — ABNORMAL LOW (ref 13.0–17.0)
MCH: 32.1 pg (ref 26.0–34.0)
MCH: 31.8 pg (ref 26.0–34.0)
MCHC: 33.2 g/dL (ref 30.0–36.0)
MCHC: 33.5 g/dL (ref 30.0–36.0)
MCV: 96.7 fL (ref 80.0–100.0)
MCV: 94.8 fL (ref 80.0–100.0)
Platelets: 202 K/uL (ref 150–400)
Platelets: 213 K/uL (ref 150–400)
RBC: 3.93 MIL/uL — ABNORMAL LOW (ref 4.22–5.81)
RBC: 4.03 MIL/uL — ABNORMAL LOW (ref 4.22–5.81)
RDW: 14 % (ref 11.5–15.5)
RDW: 13.9 % (ref 11.5–15.5)
WBC: 13.3 K/uL — ABNORMAL HIGH (ref 4.0–10.5)
WBC: 13.9 K/uL — ABNORMAL HIGH (ref 4.0–10.5)
nRBC: 0 % (ref 0.0–0.2)
nRBC: 0 % (ref 0.0–0.2)

## 2023-12-03 LAB — HIV ANTIBODY (ROUTINE TESTING W REFLEX): HIV Screen 4th Generation wRfx: NONREACTIVE

## 2023-12-03 MED ORDER — GABAPENTIN 300 MG PO CAPS
300.0000 mg | ORAL_CAPSULE | Freq: Three times a day (TID) | ORAL | Status: DC
  Administered 2023-12-03 – 2023-12-07 (×12): 300 mg via ORAL
  Filled 2023-12-03 (×14): qty 1

## 2023-12-03 MED ORDER — DEXAMETHASONE SODIUM PHOSPHATE 10 MG/ML IJ SOLN
INTRAMUSCULAR | Status: DC | PRN
  Administered 2023-12-03: 10 mg via INTRAVENOUS

## 2023-12-03 MED ORDER — OXYCODONE HCL 5 MG PO TABS: 5.0000 mg | ORAL_TABLET | Freq: Once | ORAL | Status: DC | PRN

## 2023-12-03 MED ORDER — DEXMEDETOMIDINE HCL IN NACL 80 MCG/20ML IV SOLN
INTRAVENOUS | Status: DC | PRN
  Administered 2023-12-03 (×4): 8 ug via INTRAVENOUS

## 2023-12-03 MED ORDER — ONDANSETRON HCL 4 MG/2ML IJ SOLN
INTRAMUSCULAR | Status: DC | PRN
  Administered 2023-12-03: 4 mg via INTRAVENOUS

## 2023-12-03 MED ORDER — OXYCODONE HCL 5 MG PO TABS
5.0000 mg | ORAL_TABLET | Freq: Four times a day (QID) | ORAL | Status: DC | PRN
  Administered 2023-12-03 – 2023-12-07 (×14): 10 mg via ORAL
  Filled 2023-12-03 (×17): qty 2

## 2023-12-03 MED ORDER — ACETAMINOPHEN 10 MG/ML IV SOLN: 1000.0000 mg | Freq: Once | INTRAVENOUS | Status: DC | PRN

## 2023-12-03 MED ORDER — SUCCINYLCHOLINE CHLORIDE 200 MG/10ML IV SOSY
PREFILLED_SYRINGE | INTRAVENOUS | Status: DC | PRN
  Administered 2023-12-03: 160 mg via INTRAVENOUS

## 2023-12-03 MED ORDER — LIDOCAINE HCL (CARDIAC) PF 100 MG/5ML IV SOSY
PREFILLED_SYRINGE | INTRAVENOUS | Status: DC | PRN
  Administered 2023-12-03: 80 mg via INTRAVENOUS

## 2023-12-03 MED ORDER — LACTATED RINGERS IV SOLN: INTRAVENOUS | Status: DC

## 2023-12-03 MED ORDER — HYDROMORPHONE HCL 1 MG/ML IJ SOLN
0.5000 mg | INTRAMUSCULAR | Status: DC | PRN
  Administered 2023-12-03 (×3): 0.5 mg via INTRAVENOUS
  Filled 2023-12-03 (×4): qty 0.5

## 2023-12-03 MED ORDER — METHOCARBAMOL 500 MG PO TABS
500.0000 mg | ORAL_TABLET | Freq: Three times a day (TID) | ORAL | Status: DC
  Administered 2023-12-03: 500 mg via ORAL
  Filled 2023-12-03: qty 1

## 2023-12-03 MED ORDER — ALBUMIN HUMAN 5 % IV SOLN: INTRAVENOUS | Status: DC | PRN

## 2023-12-03 MED ORDER — SODIUM CHLORIDE 0.9 % IV SOLN: INTRAVENOUS | Status: DC | PRN

## 2023-12-03 MED ORDER — SUGAMMADEX SODIUM 200 MG/2ML IV SOLN
INTRAVENOUS | Status: DC | PRN
  Administered 2023-12-03: 200 mg via INTRAVENOUS

## 2023-12-03 MED ORDER — POLYETHYLENE GLYCOL 3350 17 G PO PACK: 17.0000 g | PACK | Freq: Every day | ORAL | Status: DC | PRN

## 2023-12-03 MED ORDER — IBUPROFEN 200 MG PO TABS
600.0000 mg | ORAL_TABLET | Freq: Four times a day (QID) | ORAL | Status: DC | PRN
  Administered 2023-12-06: 600 mg via ORAL
  Filled 2023-12-03: qty 3

## 2023-12-03 MED ORDER — PROPOFOL 10 MG/ML IV BOLUS
INTRAVENOUS | Status: DC | PRN
  Administered 2023-12-03: 200 mg via INTRAVENOUS

## 2023-12-03 MED ORDER — OXYCODONE HCL 5 MG/5ML PO SOLN: 5.0000 mg | Freq: Once | ORAL | Status: DC | PRN

## 2023-12-03 MED ORDER — HYDRALAZINE HCL 20 MG/ML IJ SOLN: 10.0000 mg | INTRAMUSCULAR | Status: DC | PRN

## 2023-12-03 MED ORDER — METHOCARBAMOL 500 MG PO TABS
1000.0000 mg | ORAL_TABLET | Freq: Three times a day (TID) | ORAL | Status: AC
  Administered 2023-12-03 – 2023-12-05 (×8): 1000 mg via ORAL
  Filled 2023-12-03 (×8): qty 2

## 2023-12-03 MED ORDER — ONDANSETRON HCL 4 MG/2ML IJ SOLN: 4.0000 mg | Freq: Four times a day (QID) | INTRAMUSCULAR | Status: DC | PRN

## 2023-12-03 MED ORDER — FENTANYL CITRATE (PF) 100 MCG/2ML IJ SOLN
INTRAMUSCULAR | Status: DC | PRN
  Administered 2023-12-03: 150 ug via INTRAVENOUS
  Administered 2023-12-03 (×2): 50 ug via INTRAVENOUS

## 2023-12-03 MED ORDER — ENOXAPARIN SODIUM 30 MG/0.3ML IJ SOSY
30.0000 mg | PREFILLED_SYRINGE | Freq: Two times a day (BID) | INTRAMUSCULAR | Status: DC
  Administered 2023-12-04 – 2023-12-07 (×8): 30 mg via SUBCUTANEOUS
  Filled 2023-12-03 (×8): qty 0.3

## 2023-12-03 MED ORDER — METOPROLOL TARTRATE 5 MG/5ML IV SOLN: 5.0000 mg | Freq: Four times a day (QID) | INTRAVENOUS | Status: DC | PRN

## 2023-12-03 MED ORDER — 0.9 % SODIUM CHLORIDE (POUR BTL) OPTIME
TOPICAL | Status: DC | PRN
  Administered 2023-12-03: 1000 mL

## 2023-12-03 MED ORDER — ONDANSETRON 4 MG PO TBDP
4.0000 mg | ORAL_TABLET | Freq: Four times a day (QID) | ORAL | Status: DC | PRN
  Administered 2023-12-07: 4 mg via ORAL
  Filled 2023-12-03: qty 1

## 2023-12-03 MED ORDER — MIDAZOLAM HCL 5 MG/5ML IJ SOLN
INTRAMUSCULAR | Status: DC | PRN
  Administered 2023-12-02: 2 mg via INTRAVENOUS

## 2023-12-03 MED ORDER — PHENYLEPHRINE HCL (PRESSORS) 10 MG/ML IV SOLN
INTRAVENOUS | Status: DC | PRN
  Administered 2023-12-03 (×2): 160 ug via INTRAVENOUS

## 2023-12-03 MED ORDER — HYDROMORPHONE HCL 1 MG/ML IJ SOLN: 0.2500 mg | INTRAMUSCULAR | Status: DC | PRN

## 2023-12-03 MED ORDER — ACETAMINOPHEN 500 MG PO TABS
1000.0000 mg | ORAL_TABLET | Freq: Four times a day (QID) | ORAL | Status: DC
  Administered 2023-12-03 – 2023-12-08 (×16): 1000 mg via ORAL
  Filled 2023-12-03 (×20): qty 2

## 2023-12-03 MED ORDER — DOCUSATE SODIUM 100 MG PO CAPS
100.0000 mg | ORAL_CAPSULE | Freq: Two times a day (BID) | ORAL | Status: DC
  Administered 2023-12-03 – 2023-12-07 (×10): 100 mg via ORAL
  Filled 2023-12-03 (×11): qty 1

## 2023-12-03 MED ORDER — ROCURONIUM BROMIDE 100 MG/10ML IV SOLN
INTRAVENOUS | Status: DC | PRN
  Administered 2023-12-03: 60 mg via INTRAVENOUS

## 2023-12-03 MED ORDER — METHOCARBAMOL 1000 MG/10ML IJ SOLN: 500.0000 mg | Freq: Three times a day (TID) | INTRAMUSCULAR | Status: DC

## 2023-12-03 MED ORDER — PHENYLEPHRINE HCL-NACL 20-0.9 MG/250ML-% IV SOLN
INTRAVENOUS | Status: DC | PRN
  Administered 2023-12-03: 40 ug/min via INTRAVENOUS

## 2023-12-03 MED ORDER — ROCURONIUM BROMIDE 10 MG/ML (PF) SYRINGE
PREFILLED_SYRINGE | INTRAVENOUS | Status: AC
Start: 2023-12-03 — End: 2023-12-03
  Filled 2023-12-03: qty 30

## 2023-12-03 MED ORDER — AMISULPRIDE (ANTIEMETIC) 5 MG/2ML IV SOLN
10.0000 mg | Freq: Once | INTRAVENOUS | Status: DC | PRN
Start: 2023-12-03 — End: 2023-12-03

## 2023-12-03 NOTE — Discharge Instructions (Signed)

## 2023-12-03 NOTE — Progress Notes (Signed)
 Post-foley removal void (Foley removed at 10:40am)  12/03/23 1244  Urine Measurement/Characteristics  Urine (mL) 200 mL  Urine Color Yellow/straw  Urine Appearance Clear
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
## 2023-12-03 NOTE — Progress Notes (Signed)
 Orthopedic Tech Progress Note Patient Details:  Edward Petty 1991/01/22 [id number]  Patient ID: Edward Petty, male   DOB: September 08, 1990, 33 y.o.   MRN: [id number] LV1T not needed.  Bessy Reaney L [name] 12/03/2023, 12:44 AM

## 2023-12-03 NOTE — Anesthesia Procedure Notes (Signed)
 Procedure Name: Intubation Date/Time: 12/03/2023 12:07 AM  Performed by: Belva Koziel T, CRNAPre-anesthesia Checklist: Patient identified, Emergency Drugs available, Suction available and Patient being monitored Patient Re-evaluated:Patient Re-evaluated prior to induction Oxygen Delivery Method: Circle system utilized Preoxygenation: Pre-oxygenation with 100% oxygen Induction Type: IV induction, Rapid sequence and Cricoid Pressure applied Ventilation: Mask ventilation without difficulty Laryngoscope Size: Mac and 4 Grade View: Grade II Tube type: Oral Tube size: 7.5 mm Number of attempts: 1 Airway Equipment and Method: Stylet and Oral airway Placement Confirmation: ETT inserted through vocal cords under direct vision, positive ETCO2 and breath sounds checked- equal and bilateral Secured at: 23 cm Tube secured with: Tape Dental Injury: Teeth and Oropharynx as per pre-operative assessment
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
## 2023-12-03 NOTE — Plan of Care (Signed)

## 2023-12-03 NOTE — Transfer of Care (Signed)
 Immediate Anesthesia Transfer of Care Note  Patient: Edward Petty  Procedure(s) Performed: LAPAROTOMY, EXPLORATORY  Patient Location: PACU  Anesthesia Type:General  Level of Consciousness: awake, alert , and oriented  Airway & Oxygen Therapy: Patient Spontanous Breathing and Patient connected to nasal cannula oxygen  Post-op Assessment: Report given to RN, Post -op Vital signs reviewed and stable, and Patient moving all extremities  Post vital signs: Reviewed and stable  Last Vitals:  Vitals Value Taken Time  BP 137/89 12/03/23 01:37  Temp    Pulse 96 12/03/23 01:41  Resp 14 12/03/23 01:41  SpO2 99 % 12/03/23 01:41  Vitals shown include unfiled device data.  Last Pain:  Vitals:   12/02/23 2331  TempSrc:   PainSc: 0-No pain         Complications: No notable events documented.
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
## 2023-12-03 NOTE — TOC CAGE-AID Note (Signed)
 Transition of Care [facility]) - CAGE-AID Screening   Patient Details  Name: Edward Petty MRN: [id number] Date of Birth: February 13, 1991  Transition of Care [facility]) CM/SW Contact:    Kimra Kantor E Alfrieda Tarry, LCSW Phone Number: 12/03/2023, 2:53 PM   Clinical Narrative: Patient reports he drinks alcohol occasionally, denies other substance use. Declines SA resource needs.   CAGE-AID Screening:    Have You Ever Felt You Ought to Cut Down on Your Drinking or Drug Use?: No Have People Annoyed You By Critizing Your Drinking Or Drug Use?: No Have You Felt Bad Or Guilty About Your Drinking Or Drug Use?: No Have You Ever Had a Drink or Used Drugs First Thing In The Morning to Steady Your Nerves or to Get Rid of a Hangover?: No CAGE-AID Score: 0  Substance Abuse Education Offered: Yes

## 2023-12-04 LAB — BASIC METABOLIC PANEL WITH GFR
Anion gap: 12 (ref 5–15)
BUN: 11 mg/dL (ref 6–20)
CO2: 23 mmol/L (ref 22–32)
Calcium: 8.7 mg/dL — ABNORMAL LOW (ref 8.9–10.3)
Chloride: 102 mmol/L (ref 98–111)
Creatinine, Ser: 1.23 mg/dL (ref 0.61–1.24)
GFR, Estimated: >60 mL/min (ref >60)
Glucose, Bld: 111 mg/dL — ABNORMAL HIGH (ref 70–99)
Potassium: 3.9 mmol/L (ref 3.5–5.1)
Sodium: 137 mmol/L (ref 135–145)

## 2023-12-04 LAB — CBC
HCT: 36.3 % — ABNORMAL LOW (ref 39.0–52.0)
Hemoglobin: 12.1 g/dL — ABNORMAL LOW (ref 13.0–17.0)
MCH: 32.2 pg (ref 26.0–34.0)
MCHC: 33.3 g/dL (ref 30.0–36.0)
MCV: 96.5 fL (ref 80.0–100.0)
Platelets: 193 K/uL (ref 150–400)
RBC: 3.76 MIL/uL — ABNORMAL LOW (ref 4.22–5.81)
RDW: 14 % (ref 11.5–15.5)
WBC: 12.2 K/uL — ABNORMAL HIGH (ref 4.0–10.5)
nRBC: 0 % (ref 0.0–0.2)

## 2023-12-04 MED ORDER — NICOTINE 14 MG/24HR TD PT24
14.0000 mg | MEDICATED_PATCH | Freq: Every day | TRANSDERMAL | Status: DC
  Filled 2023-12-04 (×3): qty 1

## 2023-12-04 MED ORDER — GABAPENTIN 300 MG PO CAPS: 300.0000 mg | ORAL_CAPSULE | Freq: Two times a day (BID) | ORAL | Status: DC

## 2023-12-04 NOTE — Progress Notes (Signed)
 No compression machine noted in the room . Will order.
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
## 2023-12-04 NOTE — TOC Progression Note (Signed)
 Transition of Care [facility]) - Progression Note    Patient Details  Name: Edward Petty MRN: [id number] Date of Birth: 11-07-1990  [facility]) CM/SW Contact  Waddell Barnie Rama, RN Phone Number: 12/04/2023, 10:12 AM  Clinical Narrative:    NCM spoke with patient, he states he would like to have a rollator with the seat,  he has no preference of the agency.  NCM made referall to Jermaine with [employer] .   Expected Discharge Plan: Home/Self Care Barriers to Discharge: Continued Medical Work up  Expected Discharge Plan and Services       Living arrangements for the past 2 months: Single Family Home                                       Social Determinants of Health (SDOH) Interventions SDOH Screenings   Food Insecurity: No Food Insecurity (12/03/2023)    Readmission Risk Interventions    12/03/2023    2:50 PM  Readmission Risk Prevention Plan  Post Dischage Appt Complete  Medication Screening Complete  Transportation Screening Complete

## 2023-12-04 NOTE — Plan of Care (Signed)
  Problem: Pain Managment: Goal: General experience of comfort will improve and/or be controlled Outcome: Progressing   Problem: Safety: Goal: Ability to remain free from injury will improve Outcome: Progressing

## 2023-12-04 NOTE — Progress Notes (Signed)
 Patient attempted to leave the unit to go smoke. He was stopped by the secretary who notified RN. RN advised the patient that he is not allowed to leave the floor without an order. He became very upset stating that he has done it before and does not understand why he cannot do it again. Patient was then educated on safety reasons on why he should not leave for smoking purposes. Also offered to contact MD on call for a nicotine  patch. He replied stating that  a nicotine  patch is for crack heads and it makes no sense. MD was consulted for a PRN nicotine  order just in case the patient changes his mind. The patient is aware that the patch is there in case he changes his mind.
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
## 2023-12-05 NOTE — Plan of Care (Signed)
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
## 2023-12-05 NOTE — Anesthesia Postprocedure Evaluation (Signed)
 Anesthesia Post Note  Patient: Edward Petty  Procedure(s) Performed: LAPAROTOMY, EXPLORATORY     Patient location during evaluation: PACU Anesthesia Type: General Level of consciousness: awake and patient cooperative Pain management: pain level controlled Vital Signs Assessment: post-procedure vital signs reviewed and stable Respiratory status: spontaneous breathing, nonlabored ventilation and respiratory function stable Cardiovascular status: blood pressure returned to baseline and stable Postop Assessment: no apparent nausea or vomiting Anesthetic complications: no   No notable events documented.                  Erial Fikes

## 2023-12-06 MED ORDER — POLYETHYLENE GLYCOL 3350 17 G PO PACK
17.0000 g | PACK | Freq: Every day | ORAL | Status: DC
  Administered 2023-12-06 – 2023-12-07 (×2): 17 g via ORAL
  Filled 2023-12-06 (×2): qty 1

## 2023-12-06 NOTE — Plan of Care (Signed)
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
## 2023-12-07 ENCOUNTER — Ambulatory Visit (HOSPITAL_BASED_OUTPATIENT_CLINIC_OR_DEPARTMENT_OTHER): Admitting: Family Medicine

## 2023-12-07 MED ORDER — OXYCODONE HCL 5 MG PO TABS: 5.0000 mg | ORAL_TABLET | ORAL | Status: DC | PRN

## 2023-12-07 MED ORDER — METHOCARBAMOL 500 MG PO TABS: 1000.0000 mg | ORAL_TABLET | Freq: Four times a day (QID) | ORAL | Status: DC

## 2023-12-07 MED ORDER — GABAPENTIN 400 MG PO CAPS
400.0000 mg | ORAL_CAPSULE | Freq: Three times a day (TID) | ORAL | Status: DC
  Administered 2023-12-07 (×2): 400 mg via ORAL
  Filled 2023-12-07 (×2): qty 1

## 2023-12-07 MED ORDER — POLYETHYLENE GLYCOL 3350 17 G PO PACK
17.0000 g | PACK | Freq: Two times a day (BID) | ORAL | Status: DC
  Administered 2023-12-07: 17 g via ORAL
  Filled 2023-12-07: qty 1

## 2023-12-07 MED ORDER — METHOCARBAMOL 500 MG PO TABS
1000.0000 mg | ORAL_TABLET | Freq: Three times a day (TID) | ORAL | Status: DC
  Administered 2023-12-07 (×2): 1000 mg via ORAL
  Filled 2023-12-07 (×2): qty 2

## 2023-12-07 MED ORDER — BISACODYL 10 MG RE SUPP
10.0000 mg | Freq: Once | RECTAL | Status: AC
  Administered 2023-12-07: 10 mg via RECTAL
  Filled 2023-12-07: qty 1

## 2023-12-07 MED ORDER — HYDROMORPHONE HCL 1 MG/ML IJ SOLN: 0.5000 mg | INTRAMUSCULAR | Status: DC | PRN

## 2023-12-07 MED ORDER — OXYCODONE HCL 5 MG PO TABS
10.0000 mg | ORAL_TABLET | Freq: Four times a day (QID) | ORAL | Status: DC | PRN
  Administered 2023-12-07 – 2023-12-08 (×3): 15 mg via ORAL
  Filled 2023-12-07 (×3): qty 3

## 2023-12-07 NOTE — Plan of Care (Signed)
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
## 2023-12-07 NOTE — Progress Notes (Signed)
 Occupational Therapy Discharge Patient Details Name: Edward Petty MRN: [id number] DOB: 1991/03/15 Today's Date: 12/07/2023  Attempted to see pt for skilled OT treatment this afternoon. Pt in shower and unable to participate. Pt noted to have met all OT goals at this time and no further acute OT needs identified at this time.  OT will sign off.  Patient discharged from OT services secondary to goals met and no further OT needs identified.    Progress and discharge plan discussed with patient and/or caregiver: Patient/Caregiver agrees with plan    Leita Howell, OTR/L,CBIS  Supplemental OT - [facility] and [facility] Secure Chat Preferred   12/07/2023, 2:25 PM

## 2023-12-07 NOTE — TOC Transition Note (Signed)
 Transition of Care [facility]) - Discharge Note   Patient Details  Name: Edward Petty MRN: [id number] Date of Birth: 04/26/1991  Transition of [facility]) CM/SW Contact:  Rosalva Jon Bloch, RN Phone Number: 12/07/2023, 11:29 AM   Clinical Narrative:    After today's therapy session PT recommends RW, not rollator. Order placed for rollator.. NCM made Jermaine with [employer] aware. Equipment will be delivered to bedside prior to d/c. TOC following and will continue assisting with needs.   Final next level of care: Home/Self Care Barriers to Discharge: No Barriers Identified   Patient Goals and CMS Choice   CMS Medicare.gov Compare Post Acute Care list provided to:: Patient Choice offered to / list presented to : Patient      Discharge Placement                       Discharge Plan and Services Additional resources added to the After Visit Summary for                                       Social Drivers of Health (SDOH) Interventions SDOH Screenings   Food Insecurity: No Food Insecurity (12/03/2023)  Housing: Unknown (12/07/2023)  Transportation Needs: No Transportation Needs (12/07/2023)  Utilities: Not At Risk (12/07/2023)     Readmission Risk Interventions    12/03/2023    2:50 PM  Readmission Risk Prevention Plan  Post Dischage Appt Complete  Medication Screening Complete  Transportation Screening Complete

## 2023-12-07 NOTE — Progress Notes (Signed)
 Orthopedic Tech Progress Note Patient Details:  Edward Petty [birth year] [id number]  Ortho Devices Type of Ortho Device: Prafo boot/shoe Ortho Device/Splint Location: LLE Ortho Device/Splint Interventions: Ordered, Other (comment)   Post Interventions Patient Tolerated: Well Instructions Provided: Care of device  Delanna LITTIE Pac 12/07/2023, 11:26 AM

## 2023-12-07 NOTE — Progress Notes (Signed)
 Orthopedic Tech Progress Note Patient Details:  Edward Petty April 30, 1991 [id number]  Spoke with PT about the correct AFO patient is needing, due to him not having any shoes in house [employer] will bring him an OFF THE SHELF AFO or an FOOT UP BRACE once he come back from [facility]   Patient ID: [name], male   DOB: [birth year], 33 y.o.   MRN: [id number]  Delanna LITTIE Pac 12/07/2023, 11:55 AM
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
## 2023-12-08 ENCOUNTER — Encounter (HOSPITAL_COMMUNITY): Payer: Self-pay | Admitting: Emergency Medicine
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
## 2023-12-10 ENCOUNTER — Encounter (HOSPITAL_COMMUNITY): Payer: Self-pay

## 2023-12-10 ENCOUNTER — Other Ambulatory Visit: Payer: Self-pay

## 2023-12-10 ENCOUNTER — Emergency Department (HOSPITAL_COMMUNITY)
Admission: EM | Admit: 2023-12-10 | Discharge: 2023-12-10 | Disposition: A | Discharge status: Home / Self Care | Attending: Emergency Medicine | Admitting: Emergency Medicine

## 2023-12-10 DIAGNOSIS — W3400XD Accidental discharge from unspecified firearms or gun, subsequent encounter: Secondary | ICD-10-CM | POA: Insufficient documentation

## 2023-12-10 DIAGNOSIS — S31139D Puncture wound of abdominal wall without foreign body, unspecified quadrant without penetration into peritoneal cavity, subsequent encounter: Secondary | ICD-10-CM | POA: Insufficient documentation

## 2023-12-10 DIAGNOSIS — M79605 Pain in left leg: Secondary | ICD-10-CM | POA: Diagnosis not present

## 2023-12-10 DIAGNOSIS — R000 Tachycardia, unspecified: Secondary | ICD-10-CM | POA: Insufficient documentation

## 2023-12-10 DIAGNOSIS — M21372 Foot drop, left foot: Secondary | ICD-10-CM | POA: Diagnosis not present

## 2023-12-10 DIAGNOSIS — S3991XD Unspecified injury of abdomen, subsequent encounter: Secondary | ICD-10-CM | POA: Diagnosis present

## 2023-12-10 LAB — CBC WITH DIFFERENTIAL/PLATELET
Abs Immature Granulocytes: 0.03 K/uL (ref 0.00–0.07)
Basophils Absolute: 0 K/uL (ref 0.0–0.1)
Basophils Relative: 1 %
Eosinophils Absolute: 0.2 K/uL (ref 0.0–0.5)
Eosinophils Relative: 3 %
HCT: 41.2 % (ref 39.0–52.0)
Hemoglobin: 13.4 g/dL (ref 13.0–17.0)
Immature Granulocytes: 1 %
Lymphocytes Relative: 20 %
Lymphs Abs: 1.3 K/uL (ref 0.7–4.0)
MCH: 32.2 pg (ref 26.0–34.0)
MCHC: 32.5 g/dL (ref 30.0–36.0)
MCV: 99 fL (ref 80.0–100.0)
Monocytes Absolute: 0.6 K/uL (ref 0.1–1.0)
Monocytes Relative: 10 %
Neutro Abs: 4.2 K/uL (ref 1.7–7.7)
Neutrophils Relative %: 65 %
Platelets: 293 K/uL (ref 150–400)
RBC: 4.16 MIL/uL — ABNORMAL LOW (ref 4.22–5.81)
RDW: 13.4 % (ref 11.5–15.5)
WBC: 6.3 K/uL (ref 4.0–10.5)
nRBC: 0 % (ref 0.0–0.2)

## 2023-12-10 LAB — BASIC METABOLIC PANEL WITH GFR
Anion gap: 10 (ref 5–15)
BUN: 12 mg/dL (ref 6–20)
CO2: 22 mmol/L (ref 22–32)
Calcium: 9.2 mg/dL (ref 8.9–10.3)
Chloride: 102 mmol/L (ref 98–111)
Creatinine, Ser: 1.09 mg/dL (ref 0.61–1.24)
GFR, Estimated: >60 mL/min (ref >60)
Glucose, Bld: 101 mg/dL — ABNORMAL HIGH (ref 70–99)
Potassium: 4.3 mmol/L (ref 3.5–5.1)
Sodium: 134 mmol/L — ABNORMAL LOW (ref 135–145)

## 2023-12-10 MED ORDER — METHOCARBAMOL 1000 MG PO TABS: 750.0000 mg | ORAL_TABLET | Freq: Four times a day (QID) | ORAL | 0 refills | Status: AC | PRN

## 2023-12-10 MED ORDER — METHOCARBAMOL 500 MG PO TABS
1000.0000 mg | ORAL_TABLET | Freq: Once | ORAL | Status: AC
  Administered 2023-12-10: 1000 mg via ORAL
  Filled 2023-12-10: qty 2

## 2023-12-10 MED ORDER — GABAPENTIN 400 MG PO CAPS: 400.0000 mg | ORAL_CAPSULE | Freq: Three times a day (TID) | ORAL | 0 refills | Status: AC

## 2023-12-10 MED ORDER — OXYCODONE HCL 10 MG PO TABS: 10.0000 mg | ORAL_TABLET | Freq: Four times a day (QID) | ORAL | 0 refills | Status: AC | PRN

## 2023-12-10 MED ORDER — OXYCODONE HCL 5 MG PO TABS
10.0000 mg | ORAL_TABLET | Freq: Once | ORAL | Status: AC
  Administered 2023-12-10: 10 mg via ORAL
  Filled 2023-12-10: qty 2

## 2023-12-10 MED ORDER — GABAPENTIN 400 MG PO CAPS
400.0000 mg | ORAL_CAPSULE | Freq: Once | ORAL | Status: AC
  Administered 2023-12-10: 400 mg via ORAL
  Filled 2023-12-10: qty 1
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
## 2023-12-10 NOTE — Discharge Instructions (Addendum)
 I have sent prescriptions for medication to the pharmacy for your pain.  You were given the orthotic to wear for your leg.  Please follow-up with the surgery clinic and orthopedics.  A primary care doctor appointment was also made for you.  Return to the emergency department if any worsening or concerning symptoms
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
## 2023-12-10 NOTE — Discharge Planning (Signed)
 RNCM consulted regarding PCP establishment. Pt presented to [facility] ED today for wound check.  RNCM obtained appointment on February 04, 2024  with  Rosaline Bohr, NP and placed on After Visit Summary paperwork.  No further case management needs communicated at this time. Kanesha Cadle J. Debarah, BSN,  RN, [facility] [phone]

## 2023-12-10 NOTE — Progress Notes (Signed)
 Orthopedic Tech Progress Note Patient Details:  Edward Petty 08-04-1990 [id number]  Ordered a shoeless foot up AFO through [employer] for patient at 10:45 am on 12/10/23.  Patient ID: Edward Petty, male   DOB: 01-02-1991, 33 y.o.   MRN: [id number]  Edward Petty December 12/10/2023, 10:50 AM

## 2024-02-03 ENCOUNTER — Telehealth (INDEPENDENT_AMBULATORY_CARE_PROVIDER_SITE_OTHER): Payer: Self-pay

## 2024-02-03 NOTE — Telephone Encounter (Signed)
 Error

## 2024-02-04 ENCOUNTER — Ambulatory Visit (INDEPENDENT_AMBULATORY_CARE_PROVIDER_SITE_OTHER): Admitting: Primary Care
